# Patient Record
Sex: Female | Born: 2000 | Race: Black or African American | Hispanic: No | Marital: Single | Smoking: Never smoker
Health system: Southern US, Community
[De-identification: ages and names within clinical notes are randomized; demographics above are authoritative.]

---
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
## 2018-02-22 ENCOUNTER — Encounter (HOSPITAL_COMMUNITY): Payer: Self-pay

## 2018-02-22 ENCOUNTER — Ambulatory Visit (HOSPITAL_COMMUNITY)
Admission: EM | Admit: 2018-02-22 | Discharge: 2018-02-22 | Disposition: A | Discharge status: Home / Self Care | Payer: Medicaid Other | Attending: Family Medicine | Admitting: Family Medicine

## 2018-02-22 DIAGNOSIS — H6502 Acute serous otitis media, left ear: Secondary | ICD-10-CM

## 2018-02-22 DIAGNOSIS — H6122 Impacted cerumen, left ear: Secondary | ICD-10-CM

## 2018-02-22 MED ORDER — AMOXICILLIN 875 MG PO TABS: 875.0000 mg | ORAL_TABLET | Freq: Two times a day (BID) | ORAL | 0 refills | Status: DC

## 2018-02-22 NOTE — ED Provider Notes (Signed)
[facility]    CSN: [id number] Arrival date & time: 02/22/18  1540     History   Chief Complaint Chief Complaint  Patient presents with  . Otalgia    HPI Hayley Cannon is a 17 y.o. female.   Pt presents with ear pain In both ears and both feeling clogged and popping for the past 5 days.  She has had some slight decrease in hearing as well.  There is been no dizziness.  She said no fever.  The axilla infection that she suffered from recently has cleared.     History reviewed. No pertinent past medical history.  There are no active problems to display for this patient.   History reviewed. No pertinent surgical history.  OB History    Gravida  0   Para  0   Term  0   Preterm  0   AB  0   Living  0     SAB  0   TAB  0   Ectopic  0   Multiple  0   Live Births               Home Medications    Prior to Admission medications   Medication Sig Start Date End Date Taking? Authorizing Provider  acetaminophen (TYLENOL) 325 MG tablet Take 2 tablets (650 mg total) by mouth every 6 (six) hours as needed for mild pain or moderate pain. 10/23/16   Sherrilee GillesScoville, Brittany N, NP  amoxicillin (AMOXIL) 875 MG tablet Take 1 tablet (875 mg total) by mouth 2 (two) times daily. 02/22/18   Elvina SidleLauenstein, Sahmir Weatherbee, MD  medroxyPROGESTERone (PROVERA) 5 MG tablet Take 1 tab PO QD until bleeding resolves. 07/20/15   Burroughs, Cherre RobinsZachary Taylor, MD  polyethylene glycol powder [facility](GLYCOLAX/MIRALAX) powder Please take 8 capfuls of Miralax powder with 32-64 ounces of water for constipation cleanout. You may take 1-2 capfuls per day as needed for constipation after the cleanout is done. 11/08/15   Sherrilee GillesScoville, Brittany N, NP    Family History Family History  Problem Relation Age of Onset  . Healthy Mother   . Healthy Sister     Social History Social History   Tobacco Use  . Smoking status: Never Smoker  . Smokeless tobacco: Never Used  Substance Use Topics  . Alcohol use:  No  . Drug use: No     Allergies   Patient has no known allergies.   Review of Systems Review of Systems   Physical Exam Triage Vital Signs ED Triage Vitals  Enc Vitals Group     BP 02/22/18 1642 125/76     Pulse Rate 02/22/18 1642 64     Resp 02/22/18 1642 20     Temp 02/22/18 1642 99 F (37.2 C)     Temp Source 02/22/18 1642 Oral     SpO2 02/22/18 1642 100 %     Weight --      Height --      Head Circumference --      Peak Flow --      Pain Score 02/22/18 1644 5     Pain Loc --      Pain Edu? --      Excl. in GC? --    No data found.  Updated Vital Signs BP 125/76 (BP Location: Left Arm)   Pulse 64   Temp 99 F (37.2 C) (Oral)   Resp 20   SpO2 100%    Physical Exam  Constitutional: She  is oriented to person, place, and time. She appears well-developed and well-nourished.  HENT:  Mouth/Throat: Oropharynx is clear and moist.  Wet earwax in the left canal  Bilateral ear retraction  Eyes: Pupils are equal, round, and reactive to light. Conjunctivae are normal.  Neck: Normal range of motion. Neck supple.  Pulmonary/Chest: Effort normal.  Musculoskeletal: Normal range of motion.  Neurological: She is alert and oriented to person, place, and time.  Skin: Skin is warm and dry.  Psychiatric: She has a normal mood and affect.  Nursing note and vitals reviewed.    UC Treatments / Results  Labs (all labs ordered are listed, but only abnormal results are displayed) Labs Reviewed - No data to display  EKG None  Radiology No results found.  Procedures Procedures (including critical care time)  Medications Ordered in UC Medications - No data to display  Initial Impression / Assessment and Plan / UC Course  I have reviewed the triage vital signs and the nursing notes.  Pertinent labs & imaging results that were available during my care of the patient were reviewed by me and considered in my medical decision making (see chart for details).    Final  Clinical Impressions(s) / UC Diagnoses   Final diagnoses:  Non-recurrent acute serous otitis media of left ear  Impacted cerumen of left ear   Discharge Instructions   None    ED Prescriptions    Medication Sig Dispense Auth. Provider   amoxicillin (AMOXIL) 875 MG tablet Take 1 tablet (875 mg total) by mouth 2 (two) times daily. 14 tablet Elvina Sidle, MD     Controlled Substance Prescriptions  Controlled Substance Registry consulted? Not Applicable   Elvina Sidle, MD 02/22/18 1655

## 2018-02-22 NOTE — ED Triage Notes (Signed)
Pt presents withy ear pain In both ears and both feeling clogged and popping.

## 2018-04-01 ENCOUNTER — Encounter (HOSPITAL_COMMUNITY): Payer: Self-pay | Admitting: Emergency Medicine

## 2018-04-01 ENCOUNTER — Ambulatory Visit (HOSPITAL_COMMUNITY)
Admission: EM | Admit: 2018-04-01 | Discharge: 2018-04-01 | Disposition: A | Discharge status: Home / Self Care | Payer: Medicaid Other | Attending: Physician Assistant | Admitting: Physician Assistant

## 2018-04-01 DIAGNOSIS — H60393 Other infective otitis externa, bilateral: Secondary | ICD-10-CM | POA: Insufficient documentation

## 2018-04-01 DIAGNOSIS — H938X3 Other specified disorders of ear, bilateral: Secondary | ICD-10-CM

## 2018-04-01 MED ORDER — AMOXICILLIN-POT CLAVULANATE 875-125 MG PO TABS: 1.0000 | ORAL_TABLET | Freq: Two times a day (BID) | ORAL | 0 refills | Status: DC

## 2018-04-01 MED ORDER — FLUTICASONE PROPIONATE 50 MCG/ACT NA SUSP: 2.0000 | Freq: Every day | NASAL | 0 refills | Status: DC

## 2018-04-01 MED ORDER — OFLOXACIN 0.3 % OP SOLN: OPHTHALMIC | 0 refills | Status: DC

## 2018-04-01 NOTE — ED Triage Notes (Signed)
Pt here with bilateral ear pain and fullness

## 2018-04-01 NOTE — ED Provider Notes (Signed)
[facility]    CSN: [id number] Arrival date & time: 04/01/18  1422     History   Chief Complaint Chief Complaint  Patient presents with  . Otalgia    appt 1645    HPI Hayley Cannon is a 17 y.o. female.   17 year old female comes in for 3-4 week history of bilateral ear pain. States about 1 month ago, was treated for otitis media with amoxicillin. Symptoms seemed to have improved prior to current symptom onset. She has had ear fullness. Denies ear drainage. Denies rhinorrhea, nasal congestion, cough. Denies fever, chills, night sweats. Uses cotton swab multiple times a day.      History reviewed. No pertinent past medical history.  There are no active problems to display for this patient.   History reviewed. No pertinent surgical history.  OB History    Gravida  0   Para  0   Term  0   Preterm  0   AB  0   Living  0     SAB  0   TAB  0   Ectopic  0   Multiple  0   Live Births               Home Medications    Prior to Admission medications   Medication Sig Start Date End Date Taking? Authorizing Provider  acetaminophen (TYLENOL) 325 MG tablet Take 2 tablets (650 mg total) by mouth every 6 (six) hours as needed for mild pain or moderate pain. 10/23/16   Sherrilee GillesScoville, Brittany N, NP  amoxicillin-clavulanate (AUGMENTIN) 875-125 MG tablet Take 1 tablet by mouth every 12 (twelve) hours. 04/01/18   Cathie HoopsYu,  V, PA-C  fluticasone (FLONASE) 50 MCG/ACT nasal spray Place 2 sprays into both nostrils daily. 04/01/18   Cathie HoopsYu,  V, PA-C  medroxyPROGESTERone (PROVERA) 5 MG tablet Take 1 tab PO QD until bleeding resolves. Patient not taking: Reported on 04/01/2018 07/20/15   Drexel IhaBurroughs, Zachary Taylor, MD  ofloxacin (OCUFLOX) 0.3 % ophthalmic solution 10 drops in each ear once daily for 7 days 04/01/18   Cathie HoopsYu,  V, PA-C  polyethylene glycol powder (GLYCOLAX/MIRALAX) powder Please take 8 capfuls of Miralax powder with 32-64 ounces of water for constipation  cleanout. You may take 1-2 capfuls per day as needed for constipation after the cleanout is done. 11/08/15   Sherrilee GillesScoville, Brittany N, NP    Family History Family History  Problem Relation Age of Onset  . Healthy Mother   . Healthy Sister     Social History Social History   Tobacco Use  . Smoking status: Never Smoker  . Smokeless tobacco: Never Used  Substance Use Topics  . Alcohol use: No  . Drug use: No     Allergies   Patient has no known allergies.   Review of Systems Review of Systems  Reason unable to perform ROS: See HPI as above.     Physical Exam Triage Vital Signs ED Triage Vitals  Enc Vitals Group     BP 04/01/18 1455 (!) 135/91     Pulse Rate 04/01/18 1455 81     Resp 04/01/18 1455 18     Temp 04/01/18 1455 98.1 F (36.7 C)     Temp Source 04/01/18 1455 Oral     SpO2 04/01/18 1455 98 %     Weight --      Height --      Head Circumference --      Peak Flow --  Pain Score 04/01/18 1456 3     Pain Loc --      Pain Edu? --      Excl. in GC? --    No data found.  Updated Vital Signs BP (!) 135/91 (BP Location: Right Arm)   Pulse 81   Temp 98.1 F (36.7 C) (Oral)   Resp 18   SpO2 98%   Physical Exam Constitutional:      General: She is not in acute distress.    Appearance: She is well-developed.  HENT:     Head: Normocephalic and atraumatic.     Right Ear: External ear normal.     Left Ear: External ear normal.     Ears:     Comments: Tenderness to palpation of bilateral tragus. Mild swelling with erythema to bilateral ear canal. TM not visible due to cerumen bilaterally.     Nose: Nose normal.     Right Sinus: No maxillary sinus tenderness or frontal sinus tenderness.     Left Sinus: No maxillary sinus tenderness or frontal sinus tenderness.     Mouth/Throat:     Pharynx: Uvula midline.  Eyes:     Conjunctiva/sclera: Conjunctivae normal.     Pupils: Pupils are equal, round, and reactive to light.  Neck:     Musculoskeletal: Normal  range of motion and neck supple.  Cardiovascular:     Rate and Rhythm: Normal rate and regular rhythm.     Heart sounds: Normal heart sounds. No murmur. No friction rub. No gallop.   Pulmonary:     Effort: Pulmonary effort is normal.     Breath sounds: Normal breath sounds. No decreased breath sounds, wheezing, rhonchi or rales.  Lymphadenopathy:     Cervical: No cervical adenopathy.  Skin:    General: Skin is warm and dry.  Neurological:     Mental Status: She is alert and oriented to person, place, and time.  Psychiatric:        Behavior: Behavior normal.        Judgment: Judgment normal.      UC Treatments / Results  Labs (all labs ordered are listed, but only abnormal results are displayed) Labs Reviewed - No data to display  EKG None  Radiology No results found.  Procedures Procedures (including critical care time)  Medications Ordered in UC Medications - No data to display  Initial Impression / Assessment and Plan / UC Course  I have reviewed the triage vital signs and the nursing notes.  Pertinent labs & imaging results that were available during my care of the patient were reviewed by me and considered in my medical decision making (see chart for details).    Given otitis externa, will avoid ear irrigation. Ofloxacin as directed. Will cover for otitis media with augmentin. flonase as directed. Return precautions given. Patient expresses understanding and agrees to plan.  Final Clinical Impressions(s) / UC Diagnoses   Final diagnoses:  Other infective acute otitis externa of both ears  Sensation of fullness in both ears    ED Prescriptions    Medication Sig Dispense Auth. Provider   amoxicillin-clavulanate (AUGMENTIN) 875-125 MG tablet Take 1 tablet by mouth every 12 (twelve) hours. 14 tablet ,  V, PA-C   ofloxacin (OCUFLOX) 0.3 % ophthalmic solution 10 drops in each ear once daily for 7 days 5 mL ,  V, PA-C   fluticasone (FLONASE) 50 MCG/ACT  nasal spray Place 2 sprays into both nostrils daily. 1 g Belinda Fisher,  V,  Jeannine Boga, Lynnea Ferrier, PA-C 04/01/18 [phone]

## 2018-04-01 NOTE — Discharge Instructions (Signed)
Start augmentin and ofloxacin as directed. Flonase as directed. Avoid using qtips for now. Follow up with PCP for further evaluation if symptoms not improving.

## 2019-08-08 ENCOUNTER — Other Ambulatory Visit: Payer: Self-pay

## 2019-08-08 ENCOUNTER — Ambulatory Visit (HOSPITAL_COMMUNITY)
Admission: EM | Admit: 2019-08-08 | Discharge: 2019-08-08 | Disposition: A | Discharge status: Home / Self Care | Payer: Medicaid Other | Attending: Internal Medicine | Admitting: Internal Medicine

## 2019-08-08 ENCOUNTER — Encounter (HOSPITAL_COMMUNITY): Payer: Self-pay

## 2019-08-08 DIAGNOSIS — G43009 Migraine without aura, not intractable, without status migrainosus: Secondary | ICD-10-CM | POA: Diagnosis not present

## 2019-08-08 MED ORDER — SUMATRIPTAN SUCCINATE 6 MG/0.5ML ~~LOC~~ SOLN
6.0000 mg | Freq: Once | SUBCUTANEOUS | Status: AC
  Administered 2019-08-08: 09:00:00 6 mg via SUBCUTANEOUS

## 2019-08-08 MED ORDER — SUMATRIPTAN SUCCINATE 6 MG/0.5ML ~~LOC~~ SOLN
SUBCUTANEOUS | Status: AC
  Filled 2019-08-08: qty 0.5

## 2019-08-08 MED ORDER — SUMATRIPTAN SUCCINATE 25 MG PO TABS: 25.0000 mg | ORAL_TABLET | Freq: Once | ORAL | 0 refills | Status: DC

## 2019-08-08 MED ORDER — KETOROLAC TROMETHAMINE 30 MG/ML IJ SOLN
30.0000 mg | Freq: Once | INTRAMUSCULAR | Status: AC
  Administered 2019-08-08: 30 mg via INTRAMUSCULAR

## 2019-08-08 MED ORDER — METOCLOPRAMIDE HCL 5 MG/ML IJ SOLN
5.0000 mg | Freq: Once | INTRAMUSCULAR | Status: AC
  Administered 2019-08-08: 09:00:00 5 mg via INTRAMUSCULAR

## 2019-08-08 MED ORDER — KETOROLAC TROMETHAMINE 30 MG/ML IJ SOLN
INTRAMUSCULAR | Status: AC
  Filled 2019-08-08: qty 1

## 2019-08-08 MED ORDER — METOCLOPRAMIDE HCL 5 MG/ML IJ SOLN
INTRAMUSCULAR | Status: AC
  Filled 2019-08-08: qty 2

## 2019-08-08 MED ORDER — IBUPROFEN 600 MG PO TABS: 600.0000 mg | ORAL_TABLET | Freq: Four times a day (QID) | ORAL | 0 refills | Status: DC | PRN

## 2019-08-08 NOTE — ED Provider Notes (Addendum)
[facility]    CSN: [id number] Arrival date & time: 08/08/19  7253      History   Chief Complaint Chief Complaint  Patient presents with  . Headache    HPI Hayley Cannon is a 19 y.o. female comes to urgent care with complaints of frontal headache of 6 days duration.  Patient has frontal headache with some periorbital pain.  Headache is persistent.  Aggravated by light.  And it is relieved when she sleeps.  She denies any floaters in her visual field.  No fever or chills.  No nausea or vomiting.  No nasal congestion or runny nose.  No cough or sputum production.  No sick contacts.  No loss of taste or smell.  No history of migraines.  No double vision or blurry vision.  No extremity weakness.  HPI  History reviewed. No pertinent past medical history.  There are no problems to display for this patient.   History reviewed. No pertinent surgical history.  OB History    Gravida  0   Para  0   Term  0   Preterm  0   AB  0   Living  0     SAB  0   TAB  0   Ectopic  0   Multiple  0   Live Births               Home Medications    Prior to Admission medications   Medication Sig Start Date End Date Taking? Authorizing Provider  acetaminophen (TYLENOL) 325 MG tablet Take 2 tablets (650 mg total) by mouth every 6 (six) hours as needed for mild pain or moderate pain. 10/23/16   Jean Rosenthal, NP  fluticasone (FLONASE) 50 MCG/ACT nasal spray Place 2 sprays into both nostrils daily. 04/01/18   Tasia Catchings, Amy V, PA-C  ibuprofen (ADVIL) 600 MG tablet Take 1 tablet (600 mg total) by mouth every 6 (six) hours as needed. 08/08/19   Kadan Millstein, Myrene Galas, MD  polyethylene glycol powder (GLYCOLAX/MIRALAX) powder Please take 8 capfuls of Miralax powder with 32-64 ounces of water for constipation cleanout. You may take 1-2 capfuls per day as needed for constipation after the cleanout is done. 11/08/15   Jean Rosenthal, NP  SUMAtriptan (IMITREX) 25 MG tablet Take 1  tablet (25 mg total) by mouth once for 1 dose. May repeat in 2 hours if headache persists or recurs. 08/08/19 08/08/19  Chase Picket, MD  medroxyPROGESTERone (PROVERA) 5 MG tablet Take 1 tab PO QD until bleeding resolves. Patient not taking: Reported on 04/01/2018 07/20/15 08/08/19  Smith Mince, MD    Family History Family History  Problem Relation Age of Onset  . Healthy Mother   . Healthy Sister     Social History Social History   Tobacco Use  . Smoking status: Never Smoker  . Smokeless tobacco: Never Used  Substance Use Topics  . Alcohol use: No  . Drug use: No     Allergies   Patient has no known allergies.   Review of Systems Review of Systems  Constitutional: Negative for activity change, fatigue and fever.  HENT: Negative for congestion, ear discharge, ear pain, sinus pressure, sinus pain and sore throat.   Respiratory: Negative for cough, chest tightness and shortness of breath.   Gastrointestinal: Negative.  Negative for nausea and vomiting.  Genitourinary: Negative.   Musculoskeletal: Negative.   Neurological: Positive for headaches. Negative for dizziness and light-headedness.  Physical Exam Triage Vital Signs ED Triage Vitals  Enc Vitals Group     BP 08/08/19 0843 121/75     Pulse Rate 08/08/19 0843 61     Resp 08/08/19 0843 17     Temp 08/08/19 0843 98.4 F (36.9 C)     Temp Source 08/08/19 0843 Oral     SpO2 08/08/19 0843 98 %     Weight --      Height --      Head Circumference --      Peak Flow --      Pain Score 08/08/19 0841 8     Pain Loc --      Pain Edu? --      Excl. in GC? --    No data found.  Updated Vital Signs BP 121/75 (BP Location: Right Arm)   Pulse 61   Temp 98.4 F (36.9 C) (Oral)   Resp 17   LMP  (Within Years) Comment: 2 years, pt states her PCP is aware of this.   SpO2 98%   Visual Acuity Right Eye Distance:   Left Eye Distance:   Bilateral Distance:    Right Eye Near:   Left Eye Near:      Bilateral Near:     Physical Exam Vitals and nursing note reviewed.  Constitutional:      General: She is not in acute distress.    Appearance: She is not ill-appearing.  HENT:     Mouth/Throat:     Mouth: Mucous membranes are moist.  Eyes:     Extraocular Movements: Extraocular movements intact.     Right eye: Normal extraocular motion and no nystagmus.     Left eye: Normal extraocular motion and no nystagmus.     Pupils: Pupils are equal, round, and reactive to light.  Cardiovascular:     Rate and Rhythm: Normal rate and regular rhythm.  Pulmonary:     Effort: Pulmonary effort is normal.     Breath sounds: Normal breath sounds. No wheezing or rhonchi.  Abdominal:     General: Bowel sounds are normal.     Palpations: Abdomen is soft.  Musculoskeletal:     Cervical back: Normal range of motion.  Skin:    General: Skin is warm and dry.     Capillary Refill: Capillary refill takes less than 2 seconds.  Neurological:     Mental Status: She is alert.     GCS: GCS eye subscore is 4. GCS verbal subscore is 5. GCS motor subscore is 6.     Cranial Nerves: No cranial nerve deficit or dysarthria.     Sensory: No sensory deficit.     Motor: No weakness.     Deep Tendon Reflexes: Reflexes normal.      UC Treatments / Results  Labs (all labs ordered are listed, but only abnormal results are displayed) Labs Reviewed - No data to display  EKG   Radiology No results found.  Procedures Procedures (including critical care time)  Medications Ordered in UC Medications  ketorolac (TORADOL) 30 MG/ML injection 30 mg (has no administration in time range)  metoCLOPramide (REGLAN) injection 5 mg (has no administration in time range)  SUMAtriptan (IMITREX) injection 6 mg (has no administration in time range)    Initial Impression / Assessment and Plan / UC Course  I have reviewed the triage vital signs and the nursing notes.  Pertinent labs & imaging results that were available  during my care of the  patient were reviewed by me and considered in my medical decision making (see chart for details).     1.  Headache: This is likely a migraine headache Headache protocol-Toradol, Reglan, Imitrex. Prescribe ibuprofen and Imitrex Return precautions given If patient continues to have headaches she may need neurology evaluation and or CT scan of the head.  Patient verbalized understanding of the plan. Final Clinical Impressions(s) / UC Diagnoses   Final diagnoses:  Migraine without aura and without status migrainosus, not intractable   Discharge Instructions   None    ED Prescriptions    Medication Sig Dispense Auth. Provider   ibuprofen (ADVIL) 600 MG tablet Take 1 tablet (600 mg total) by mouth every 6 (six) hours as needed. 30 tablet Natalee Tomkiewicz, Britta Mccreedy, MD   SUMAtriptan (IMITREX) 25 MG tablet Take 1 tablet (25 mg total) by mouth once for 1 dose. May repeat in 2 hours if headache persists or recurs. 15 tablet Avaley Coop, Britta Mccreedy, MD     PDMP not reviewed this encounter.   Merrilee Jansky, MD 08/08/19 1610    Merrilee Jansky, MD 08/08/19 [phone]

## 2019-08-08 NOTE — ED Triage Notes (Signed)
Pt reports having headaches x 6 days. Pt reports she feels pressure in her forehead and under her eyes.

## 2019-10-24 ENCOUNTER — Encounter (HOSPITAL_COMMUNITY): Payer: Self-pay | Admitting: Emergency Medicine

## 2019-10-24 ENCOUNTER — Emergency Department (HOSPITAL_COMMUNITY)
Admission: EM | Admit: 2019-10-24 | Discharge: 2019-10-24 | Disposition: A | Discharge status: Home / Self Care | Payer: Medicaid Other | Attending: Emergency Medicine | Admitting: Emergency Medicine

## 2019-10-24 ENCOUNTER — Emergency Department (HOSPITAL_COMMUNITY): Payer: Medicaid Other

## 2019-10-24 ENCOUNTER — Other Ambulatory Visit: Payer: Self-pay

## 2019-10-24 DIAGNOSIS — X501XXA Overexertion from prolonged static or awkward postures, initial encounter: Secondary | ICD-10-CM | POA: Insufficient documentation

## 2019-10-24 DIAGNOSIS — W19XXXA Unspecified fall, initial encounter: Secondary | ICD-10-CM

## 2019-10-24 DIAGNOSIS — S93402A Sprain of unspecified ligament of left ankle, initial encounter: Secondary | ICD-10-CM | POA: Insufficient documentation

## 2019-10-24 DIAGNOSIS — Y999 Unspecified external cause status: Secondary | ICD-10-CM | POA: Diagnosis not present

## 2019-10-24 DIAGNOSIS — Y929 Unspecified place or not applicable: Secondary | ICD-10-CM | POA: Insufficient documentation

## 2019-10-24 DIAGNOSIS — W010XXA Fall on same level from slipping, tripping and stumbling without subsequent striking against object, initial encounter: Secondary | ICD-10-CM | POA: Insufficient documentation

## 2019-10-24 DIAGNOSIS — S99912A Unspecified injury of left ankle, initial encounter: Secondary | ICD-10-CM | POA: Diagnosis present

## 2019-10-24 DIAGNOSIS — Y939 Activity, unspecified: Secondary | ICD-10-CM | POA: Diagnosis not present

## 2019-10-24 MED ORDER — IBUPROFEN 400 MG PO TABS
600.0000 mg | ORAL_TABLET | Freq: Once | ORAL | Status: AC
  Administered 2019-10-24: 600 mg via ORAL
  Filled 2019-10-24: qty 1

## 2019-10-24 NOTE — ED Notes (Signed)
Patient transported to X-ray 

## 2019-10-24 NOTE — ED Provider Notes (Signed)
[facility] EMERGENCY DEPARTMENT Provider Note   CSN: [id number] Arrival date & time: 10/24/19  0204     History Chief Complaint  Patient presents with  . Ankle Pain    Hayley Cannon is a 19 y.o. female with no significant past medical history who presents the emergency department with a chief complaint of fall.  The patient reports that she got out of bed to use the restroom just prior to arrival when she tripped and fell over a toy in the floor.  She twisted her ankle when she fell.  Reports that she fell forward.  She denies hitting her head, LOC, nausea, or vomiting.  She reports that she is having pain from her left thigh to her left foot.  Reports that it took her approximately 20 minutes to get up after the fall.  She has been unable to bear weight on the left leg since the injury.  No history of previous left leg injuries or surgeries.  No treatment prior to arrival.  She denies numbness, weakness, left hip pain, back pain, neck pain.  No concerns for pregnancy at this time.  The history is provided by the patient and medical records. No language interpreter was used.       History reviewed. No pertinent past medical history.  There are no problems to display for this patient.   History reviewed. No pertinent surgical history.   OB History    Gravida  0   Para  0   Term  0   Preterm  0   AB  0   Living  0     SAB  0   TAB  0   Ectopic  0   Multiple  0   Live Births              Family History  Problem Relation Age of Onset  . Healthy Mother   . Healthy Sister     Social History   Tobacco Use  . Smoking status: Never Smoker  . Smokeless tobacco: Never Used  Substance Use Topics  . Alcohol use: No  . Drug use: No    Home Medications Prior to Admission medications   Medication Sig Start Date End Date Taking? Authorizing Provider  acetaminophen (TYLENOL) 325 MG tablet Take 2 tablets (650 mg total) by mouth every 6  (six) hours as needed for mild pain or moderate pain. 10/23/16   Sherrilee Gilles, NP  fluticasone (FLONASE) 50 MCG/ACT nasal spray Place 2 sprays into both nostrils daily. 04/01/18   Cathie Hoops, Amy V, PA-C  ibuprofen (ADVIL) 600 MG tablet Take 1 tablet (600 mg total) by mouth every 6 (six) hours as needed. 08/08/19   Lamptey, Britta Mccreedy, MD  polyethylene glycol powder (GLYCOLAX/MIRALAX) powder Please take 8 capfuls of Miralax powder with 32-64 ounces of water for constipation cleanout. You may take 1-2 capfuls per day as needed for constipation after the cleanout is done. 11/08/15   Sherrilee Gilles, NP  SUMAtriptan (IMITREX) 25 MG tablet Take 1 tablet (25 mg total) by mouth once for 1 dose. May repeat in 2 hours if headache persists or recurs. 08/08/19 08/08/19  Merrilee Jansky, MD  medroxyPROGESTERone (PROVERA) 5 MG tablet Take 1 tab PO QD until bleeding resolves. Patient not taking: Reported on 04/01/2018 07/20/15 08/08/19  Drexel Iha, MD    Allergies    Patient has no known allergies.  Review of Systems   Review of Systems  Constitutional: Negative for activity change, chills and fever.  Respiratory: Negative for shortness of breath.   Cardiovascular: Negative for chest pain.  Gastrointestinal: Negative for abdominal pain.  Musculoskeletal: Positive for arthralgias, gait problem, joint swelling and myalgias. Negative for back pain, neck pain and neck stiffness.  Skin: Negative for rash and wound.  Neurological: Negative for weakness and numbness.    Physical Exam Updated Vital Signs BP 128/84 (BP Location: Right Arm)   Pulse 72   Temp 98.7 F (37.1 C) (Oral)   Resp 16   SpO2 100%   Physical Exam Vitals and nursing note reviewed.  Constitutional:      General: She is not in acute distress. HENT:     Head: Normocephalic.  Eyes:     Conjunctiva/sclera: Conjunctivae normal.  Cardiovascular:     Rate and Rhythm: Normal rate and regular rhythm.     Heart sounds: No murmur  heard.  No friction rub. No gallop.   Pulmonary:     Effort: Pulmonary effort is normal. No respiratory distress.  Abdominal:     General: There is no distension.     Palpations: Abdomen is soft.  Musculoskeletal:        General: Swelling, tenderness and signs of injury present. No deformity.     Cervical back: Neck supple.     Right lower leg: No edema.     Comments: Spine is nontender.  No crepitus or step-offs.  Normal exam of the right lower extremity.  No tenderness to the left hip.  No medial or lateral joint line tenderness to the left knee.  No tenderness to the patella.  Full active and passive range of motion of the left knee.  With range of motion of the left knee, she has pain in the left calf.  She also has reproducible focal tenderness to palpation to the mid calf on exam.  Achilles tendon is intact.  No ecchymosis or wounds noted to the left lower leg.  She is tender to palpation over the lateral malleolus of the left ankle.  No medial malleolus tenderness.  No tenderness to the left foot.  Sensation is intact throughout.  DP and PT pulses are 2+ and symmetric.  Good capillary refill of the bilateral lower extremities.  Skin:    General: Skin is warm.     Findings: No rash.  Neurological:     Mental Status: She is alert.  Psychiatric:        Behavior: Behavior normal.     ED Results / Procedures / Treatments   Labs (all labs ordered are listed, but only abnormal results are displayed) Labs Reviewed - No data to display  EKG None  Radiology DG Tibia/Fibula Left  Result Date: 10/24/2019 CLINICAL DATA:  Fall.  Pain. EXAM: LEFT TIBIA AND FIBULA - 2 VIEW COMPARISON:  No prior. FINDINGS: No acute soft tissue bony abnormality identified. No evidence of fracture. No radiopaque foreign body. IMPRESSION: No acute abnormality identified. Electronically Signed   By: Maisie Fus  Register   On: 10/24/2019 06:28   DG Ankle Complete Left  Result Date: 10/24/2019 CLINICAL DATA:   Tripped, swelling, pain, inability to bear weight EXAM: LEFT ANKLE COMPLETE - 3+ VIEW COMPARISON:  None. FINDINGS: Frontal, oblique, lateral views of the left ankle are obtained. No fracture, subluxation, or dislocation. Joint spaces are well preserved. Soft tissues are normal. IMPRESSION: 1. Unremarkable left ankle. Electronically Signed   By: Sharlet Salina M.D.   On: 10/24/2019 02:56  Procedures Procedures (including critical care time)  Medications Ordered in ED Medications  ibuprofen (ADVIL) tablet 600 mg (600 mg Oral Given 10/24/19 7353)    ED Course  I have reviewed the triage vital signs and the nursing notes.  Pertinent labs & imaging results that were available during my care of the patient were reviewed by me and considered in my medical decision making (see chart for details).    MDM Rules/Calculators/A&P                          19 year old female with no significant past medical history who presents to the emergency department after a fall.  She did not hit her head.  No syncope, nausea, vomiting.  She is having pain in her left leg.  She is tender to palpation to the left ankle as well as the left lower leg.  Achilles tendon is intact.  Left knee is nontender to palpation, but she does have referred pain to the lower leg.  There is pain and swelling noted to the lateral malleolus on the left.  Left foot is nontender.  She is neurovascularly intact.  X-ray of the left ankle is unremarkable.  However, given her point tenderness to the mid lower leg on exam, will order x-ray of the tibia and fibula.  If negative, patient can be discharged to home with RICE therapy, crutches, and ankle brace with orthopedics follow-up as needed. X-ray of the left tibia and fibula is negative.  Referral given.  ER return precautions given.  She is hemodynamically stable to no acute distress.  Safe for discharge home with outpatient follow-up.  Final Clinical Impression(s) / ED Diagnoses Final  diagnoses:  Fall from standing, initial encounter  Sprain of left ankle, unspecified ligament, initial encounter    Rx / DC Orders ED Discharge Orders    None       Barkley Boards, PA-C 10/24/19 0653    Nira Conn, MD 10/24/19 [phone]

## 2019-10-24 NOTE — ED Notes (Signed)
All discharge instructions reviewed with pt, who verbalized understanding. Pt discharged without issue.  

## 2019-10-24 NOTE — ED Triage Notes (Signed)
Pt reports tripping over a toy twisting his left ankle.  Pt reports she is unable to put pressure on ankle that is swelling.

## 2019-10-24 NOTE — Discharge Instructions (Signed)
Thank you for allowing me to care for you today in the Emergency Department.   You were seen today for a fall.  Your x-rays were negative.  Your exam is consistent with an ankle sprain.  Take 650 mg of Tylenol or 600 mg of ibuprofen with food every 6 hours for pain.  You can alternate between these 2 medications every 3 hours if your pain returns.  For instance, you can take Tylenol at noon, followed by a dose of ibuprofen at 3, followed by second dose of Tylenol and 6.  Apply ice pack for 15 to 20 minutes up to 3-4 times a day.  Try to elevate your left leg so that your toes are at or above the level of your nose.  Wear the brace on your left ankle to provide compression and help with pain.  Use the crutches until you can easily put weight on your left foot.  You can follow-up with primary care if your symptoms do not significantly improve within the next week.  If you do not have a primary care clinician, you can call the number on your discharge paperwork to get established with one.  Return to the emergency department if you have any fall or injury, if your toes turn blue, if you develop new weakness, numbness, or other new, concerning symptoms.

## 2019-10-24 NOTE — Progress Notes (Signed)
Orthopedic Tech Progress Note Patient Details:  Hayley Cannon Apr 08, 2000 [id number]  Ortho Devices Type of Ortho Device: ASO, Crutches Ortho Device/Splint Location: LLE Ortho Device/Splint Interventions: Application, Adjustment   Post Interventions Patient Tolerated: Well, Ambulated well Instructions Provided: Adjustment of device, Poper ambulation with device   Hayley Cannon [name] 10/24/2019, 6:59 AM

## 2019-10-29 ENCOUNTER — Institutional Professional Consult (permissible substitution): Payer: Medicaid Other | Admitting: Plastic Surgery

## 2020-10-18 ENCOUNTER — Emergency Department
Admission: EM | Admit: 2020-10-18 | Discharge: 2020-10-19 | Disposition: A | Discharge status: Home / Self Care | Payer: Medicaid Other | Attending: Emergency Medicine | Admitting: Emergency Medicine

## 2020-10-18 ENCOUNTER — Emergency Department: Payer: Medicaid Other

## 2020-10-18 ENCOUNTER — Other Ambulatory Visit: Payer: Self-pay

## 2020-10-18 DIAGNOSIS — U071 COVID-19: Secondary | ICD-10-CM | POA: Diagnosis not present

## 2020-10-18 DIAGNOSIS — R059 Cough, unspecified: Secondary | ICD-10-CM | POA: Diagnosis present

## 2020-10-18 LAB — URINALYSIS, COMPLETE (UACMP) WITH MICROSCOPIC
Bilirubin Urine: NEGATIVE
Glucose, UA: NEGATIVE mg/dL
Ketones, ur: 5 mg/dL — AB
Leukocytes,Ua: NEGATIVE
Nitrite: NEGATIVE
Protein, ur: NEGATIVE mg/dL
Specific Gravity, Urine: 1.008 (ref 1.005–1.030)
pH: 7 (ref 5.0–8.0)

## 2020-10-18 LAB — COMPREHENSIVE METABOLIC PANEL
ALT: 28 U/L (ref 0–44)
AST: 27 U/L (ref 15–41)
Albumin: 4.3 g/dL (ref 3.5–5.0)
Alkaline Phosphatase: 66 U/L (ref 38–126)
Anion gap: 7 (ref 5–15)
BUN: 8 mg/dL (ref 6–20)
CO2: 25 mmol/L (ref 22–32)
Calcium: 9.2 mg/dL (ref 8.9–10.3)
Chloride: 104 mmol/L (ref 98–111)
Creatinine, Ser: 0.82 mg/dL (ref 0.44–1.00)
GFR, Estimated: >60 mL/min (ref >60)
Glucose, Bld: 101 mg/dL — ABNORMAL HIGH (ref 70–99)
Potassium: 3.4 mmol/L — ABNORMAL LOW (ref 3.5–5.1)
Sodium: 136 mmol/L (ref 135–145)
Total Bilirubin: 0.8 mg/dL (ref 0.3–1.2)
Total Protein: 7.7 g/dL (ref 6.5–8.1)

## 2020-10-18 LAB — RESP PANEL BY RT-PCR (FLU A&B, COVID) ARPGX2
Influenza A by PCR: NEGATIVE
Influenza B by PCR: NEGATIVE
SARS Coronavirus 2 by RT PCR: POSITIVE — AB

## 2020-10-18 LAB — CBC
HCT: 41.8 % (ref 36.0–46.0)
Hemoglobin: 13.2 g/dL (ref 12.0–15.0)
MCH: 28.7 pg (ref 26.0–34.0)
MCHC: 31.6 g/dL (ref 30.0–36.0)
MCV: 90.9 fL (ref 80.0–100.0)
Platelets: 281 10*3/uL (ref 150–400)
RBC: 4.6 MIL/uL (ref 3.87–5.11)
RDW: 13.2 % (ref 11.5–15.5)
WBC: 8 10*3/uL (ref 4.0–10.5)
nRBC: 0 % (ref 0.0–0.2)

## 2020-10-18 LAB — LIPASE, BLOOD: Lipase: 22 U/L (ref 11–51)

## 2020-10-18 LAB — POC URINE PREG, ED: Preg Test, Ur: NEGATIVE

## 2020-10-18 MED ORDER — ACETAMINOPHEN 325 MG PO TABS
650.0000 mg | ORAL_TABLET | Freq: Once | ORAL | Status: AC
  Administered 2020-10-18: 650 mg via ORAL
  Filled 2020-10-18: qty 2

## 2020-10-18 MED ORDER — NAPROXEN 500 MG PO TABS
500.0000 mg | ORAL_TABLET | Freq: Once | ORAL | Status: AC
  Administered 2020-10-18: 500 mg via ORAL
  Filled 2020-10-18: qty 1

## 2020-10-18 MED ORDER — NIRMATRELVIR/RITONAVIR (PAXLOVID)TABLET: 3.0000 | ORAL_TABLET | Freq: Two times a day (BID) | ORAL | 0 refills | Status: AC

## 2020-10-18 MED ORDER — ONDANSETRON 4 MG PO TBDP
4.0000 mg | ORAL_TABLET | Freq: Once | ORAL | Status: AC | PRN
  Administered 2020-10-18: 4 mg via ORAL
  Filled 2020-10-18: qty 1

## 2020-10-18 NOTE — ED Notes (Signed)
covid pos called from lab. Dr. Katrinka Blazing alerted

## 2020-10-18 NOTE — ED Triage Notes (Signed)
Pt presents to ER c/o cough, cold chills, body aches and vomiting that started yesterday.  Pt denies being around anyone sick.  Pt A&O x4 and ambulatory to triage.

## 2020-10-18 NOTE — ED Provider Notes (Addendum)
[facility] Emergency Department Provider Note ____________________________________________   Event Date/Time   First MD Initiated Contact with Patient 10/18/20 2050     (approximate)  I have reviewed the triage vital signs and the nursing notes.  HISTORY  Chief Complaint Cough and Emesis   HPI Hayley Cannon is a 20 y.o. femalewho presents to the ED for evaluation of cough myalgias and emesis.  Chart review indicates morbidly obese patient  Patient presents to the ED for evaluation of 2 days of generalized myalgias, generalized malaise, nonproductive cough, shortness of breath, sore throat and postprandial emesis.  She denies any abdominal pain, syncopal episodes, productive cough, chest pain or pressure.  She reports a sore throat and mild odynophagia.  No known COVID-19 contacts.  Negative for dysuria.   History reviewed. No pertinent past medical history.  There are no problems to display for this patient.   History reviewed. No pertinent surgical history.  Prior to Admission medications   Medication Sig Start Date End Date Taking? Authorizing Provider  nirmatrelvir/ritonavir EUA (PAXLOVID) TABS Take 3 tablets by mouth 2 (two) times daily for 5 days. Patient GFR is >60. Take nirmatrelvir (150 mg) two tablets twice daily for 5 days and ritonavir (100 mg) one tablet twice daily for 5 days. 10/18/20 10/23/20 Yes Delton Prairie, MD  acetaminophen (TYLENOL) 325 MG tablet Take 2 tablets (650 mg total) by mouth every 6 (six) hours as needed for mild pain or moderate pain. 10/23/16   Sherrilee Gilles, NP  fluticasone (FLONASE) 50 MCG/ACT nasal spray Place 2 sprays into both nostrils daily. 04/01/18   Cathie Hoops, Amy V, PA-C  ibuprofen (ADVIL) 600 MG tablet Take 1 tablet (600 mg total) by mouth every 6 (six) hours as needed. 08/08/19   Lamptey, Britta Mccreedy, MD  polyethylene glycol powder (GLYCOLAX/MIRALAX) powder Please take 8 capfuls of Miralax powder with 32-64 ounces  of water for constipation cleanout. You may take 1-2 capfuls per day as needed for constipation after the cleanout is done. 11/08/15   Sherrilee Gilles, NP  SUMAtriptan (IMITREX) 25 MG tablet Take 1 tablet (25 mg total) by mouth once for 1 dose. May repeat in 2 hours if headache persists or recurs. 08/08/19 08/08/19  Merrilee Jansky, MD  medroxyPROGESTERone (PROVERA) 5 MG tablet Take 1 tab PO QD until bleeding resolves. Patient not taking: Reported on 04/01/2018 07/20/15 08/08/19  Drexel Iha, MD    Allergies Patient has no known allergies.  Family History  Problem Relation Age of Onset   Healthy Mother    Healthy Sister     Social History Social History   Tobacco Use   Smoking status: Never   Smokeless tobacco: Never  Substance Use Topics   Alcohol use: No   Drug use: No    Review of Systems  Constitutional: Positive for subjective fevers and chills  Eyes: No visual changes. ENT: Positive for sore throat. Cardiovascular: Denies chest pain. Respiratory: Positive for nonproductive cough Gastrointestinal: No abdominal pain.   No diarrhea.  No constipation. Positive postprandial and painless nausea and vomiting Genitourinary: Negative for dysuria. Musculoskeletal: Positive for diffuse myalgias Skin: Negative for rash. Neurological: Negative for headaches, focal weakness or numbness.  ____________________________________________   PHYSICAL EXAM:  VITAL SIGNS: Vitals:   10/18/20 2028 10/19/20 0000  BP: (!) 144/93 121/68  Pulse: (!) 114 60  Resp: (!) 22 16  Temp: (!) 103 F (39.4 C) 98.3 F (36.8 C)  SpO2: 100% 99%  Constitutional: Alert and oriented. in no acute distress.  Obese.  Appears uncomfortable.  Conversational. Eyes: Conjunctivae are normal. PERRL. EOMI. Head: Atraumatic. Nose: No congestion/rhinnorhea. Mouth/Throat: Mucous membranes are moist.  Oropharynx non-erythematous.  Uvula is midline and tonsils 1+ bilaterally without  exudate Neck: No stridor. No cervical spine tenderness to palpation. Cardiovascular: Tachycardic rate, regular rhythm. Grossly normal heart sounds.  Good peripheral circulation. Respiratory: Normal respiratory effort.  No retractions. Lungs CTAB. Gastrointestinal: Soft , nondistended, nontender to palpation. No CVA tenderness.  Benign throughout. Musculoskeletal: No lower extremity tenderness nor edema.  No joint effusions. No signs of acute trauma. Neurologic:  Normal speech and language. No gross focal neurologic deficits are appreciated. No gait instability noted. Skin:  Skin is warm, dry and intact. No rash noted. Psychiatric: Mood and affect are normal. Speech and behavior are normal.  ____________________________________________   LABS (all labs ordered are listed, but only abnormal results are displayed)  Labs Reviewed  RESP PANEL BY RT-PCR (FLU A&B, COVID) ARPGX2 - Abnormal; Notable for the following components:      Result Value   SARS Coronavirus 2 by RT PCR POSITIVE (*)    All other components within normal limits  COMPREHENSIVE METABOLIC PANEL - Abnormal; Notable for the following components:   Potassium 3.4 (*)    Glucose, Bld 101 (*)    All other components within normal limits  URINALYSIS, COMPLETE (UACMP) WITH MICROSCOPIC - Abnormal; Notable for the following components:   Color, Urine YELLOW (*)    APPearance HAZY (*)    Hgb urine dipstick MODERATE (*)    Ketones, ur 5 (*)    Bacteria, UA RARE (*)    All other components within normal limits  POC URINE PREG, ED - Normal  LIPASE, BLOOD  CBC   ____________________________________________  12 Lead EKG   ____________________________________________  RADIOLOGY  ED MD interpretation: 2 view CXR reviewed by me without evidence of acute cardiopulmonary pathology.  Official radiology report(s): DG Chest 2 View  Result Date: 10/18/2020 CLINICAL DATA:  Fever, cough EXAM: CHEST - 2 VIEW COMPARISON:  None.  FINDINGS: The heart size and mediastinal contours are within normal limits. Both lungs are clear. The visualized skeletal structures are unremarkable. IMPRESSION: No active cardiopulmonary disease. Electronically Signed   By: Helyn Numbers MD   On: 10/18/2020 21:41    ____________________________________________   PROCEDURES and INTERVENTIONS  Procedure(s) performed (including Critical Care):  Procedures  Medications  ondansetron (ZOFRAN-ODT) disintegrating tablet 4 mg (4 mg Oral Given 10/18/20 2034)  acetaminophen (TYLENOL) tablet 650 mg (650 mg Oral Given 10/18/20 2034)  naproxen (NAPROSYN) tablet 500 mg (500 mg Oral Given 10/18/20 2213)    ____________________________________________   MDM / ED COURSE   Obese but otherwise healthy 20 year old female presents to the ED with viral syndrome, consistent with COVID-19, and amenable to outpatient management with initiation of Paxlovid.  She presents febrile and tachycardic, resolving with antipyretics.  CXR without infiltrate to suggest CAP.  No evidence of bacterial etiology of her symptoms.  Doubt bacterial sepsis considering how well she looks clinically.  No evidence of pancreatitis.  No significant blood work derangements.  We will discharge with symptomatic measures and Paxlovid with return precautions.      ____________________________________________   FINAL CLINICAL IMPRESSION(S) / ED DIAGNOSES  Final diagnoses:  COVID-19     ED Discharge Orders          Ordered    nirmatrelvir/ritonavir EUA (PAXLOVID) TABS  2 times daily  10/18/20 2349             Delton Prairie   Note:  This document was prepared using Dragon voice recognition software and may include unintentional dictation errors.    Delton Prairie, MD 10/18/20 3545    Delton Prairie, MD 10/19/20 [id number]

## 2020-10-18 NOTE — Discharge Instructions (Addendum)
Use Tylenol for pain and fevers.  Up to 1000 mg per dose, up to 4 times per day.  Do not take more than 4000 mg of Tylenol/acetaminophen within 24 hours..  Use naproxen/Aleve for anti-inflammatory pain relief. Use up to 500mg  every 12 hours. Do not take more frequently than this. Do not use other NSAIDs (ibuprofen, Advil) while taking this medication. It is safe to take Tylenol with this.   Will be discharged to prescription for Paxlovid to take twice daily for the next 5 days to reduce the severity and length of the infection of your COVID-19 illness.  Return to the ED with any further worsening symptoms despite these measures.

## 2020-10-19 NOTE — ED Notes (Signed)
Pt alert and oriented x4. Verbalized followup. No questions at this time

## 2021-05-20 ENCOUNTER — Emergency Department
Admission: EM | Admit: 2021-05-20 | Discharge: 2021-05-20 | Disposition: A | Discharge status: Home / Self Care | Payer: Medicaid Other | Attending: Emergency Medicine | Admitting: Emergency Medicine

## 2021-05-20 ENCOUNTER — Encounter: Payer: Self-pay | Admitting: Emergency Medicine

## 2021-05-20 ENCOUNTER — Other Ambulatory Visit: Payer: Self-pay

## 2021-05-20 ENCOUNTER — Emergency Department: Payer: Medicaid Other

## 2021-05-20 DIAGNOSIS — M79672 Pain in left foot: Secondary | ICD-10-CM | POA: Insufficient documentation

## 2021-05-20 DIAGNOSIS — Y990 Civilian activity done for income or pay: Secondary | ICD-10-CM | POA: Insufficient documentation

## 2021-05-20 DIAGNOSIS — S9032XA Contusion of left foot, initial encounter: Secondary | ICD-10-CM | POA: Insufficient documentation

## 2021-05-20 DIAGNOSIS — W208XXA Other cause of strike by thrown, projected or falling object, initial encounter: Secondary | ICD-10-CM | POA: Insufficient documentation

## 2021-05-20 DIAGNOSIS — S99922A Unspecified injury of left foot, initial encounter: Secondary | ICD-10-CM | POA: Diagnosis present

## 2021-05-20 MED ORDER — IBUPROFEN 800 MG PO TABS
800.0000 mg | ORAL_TABLET | Freq: Once | ORAL | Status: AC
  Administered 2021-05-20: 800 mg via ORAL
  Filled 2021-05-20: qty 1

## 2021-05-20 MED ORDER — IBUPROFEN 800 MG PO TABS: 800.0000 mg | ORAL_TABLET | Freq: Three times a day (TID) | ORAL | 0 refills | Status: DC | PRN

## 2021-05-20 NOTE — ED Triage Notes (Addendum)
Patient ambulatory to triage with steady gait, without difficulty or distress noted; pt reports dropped a cart on her left foot yesterday while at work; declines desire to file workers comp

## 2021-05-20 NOTE — ED Provider Notes (Signed)
[facility] Provider Note    Event Date/Time   First MD Initiated Contact with Patient 05/20/21 [phone]     (approximate)   History   No chief complaint on file.   HPI  Hayley Cannon is a 21 y.o. female history of obesity who presents to the emergency department with left foot pain.  She states that at work yesterday she dropped a cart onto her left foot.  She states she is able to ambulate but it is very painful.  No other injury.  No numbness.   History provided by patient.    History reviewed. No pertinent past medical history.  History reviewed. No pertinent surgical history.  MEDICATIONS:  Prior to Admission medications   Medication Sig Start Date End Date Taking? Authorizing Provider  acetaminophen (TYLENOL) 325 MG tablet Take 2 tablets (650 mg total) by mouth every 6 (six) hours as needed for mild pain or moderate pain. 10/23/16   Sherrilee Gilles, NP  fluticasone (FLONASE) 50 MCG/ACT nasal spray Place 2 sprays into both nostrils daily. 04/01/18   Cathie Hoops, Amy V, PA-C  ibuprofen (ADVIL) 600 MG tablet Take 1 tablet (600 mg total) by mouth every 6 (six) hours as needed. 08/08/19   Lamptey, Britta Mccreedy, MD  polyethylene glycol powder (GLYCOLAX/MIRALAX) powder Please take 8 capfuls of Miralax powder with 32-64 ounces of water for constipation cleanout. You may take 1-2 capfuls per day as needed for constipation after the cleanout is done. 11/08/15   Sherrilee Gilles, NP  SUMAtriptan (IMITREX) 25 MG tablet Take 1 tablet (25 mg total) by mouth once for 1 dose. May repeat in 2 hours if headache persists or recurs. 08/08/19 08/08/19  Merrilee Jansky, MD  medroxyPROGESTERone (PROVERA) 5 MG tablet Take 1 tab PO QD until bleeding resolves. Patient not taking: Reported on 04/01/2018 07/20/15 08/08/19  Drexel Iha, MD    Physical Exam   Triage Vital Signs: ED Triage Vitals  Enc Vitals Group     BP 05/20/21 0259 120/73     Pulse Rate 05/20/21 0259  75     Resp 05/20/21 0259 19     Temp 05/20/21 0259 98.5 F (36.9 C)     Temp src --      SpO2 05/20/21 0259 99 %     Weight 05/20/21 0256 219 lb (99.3 kg)     Height 05/20/21 0256 5\' 1"  (1.549 m)     Head Circumference --      Peak Flow --      Pain Score 05/20/21 0256 10     Pain Loc --      Pain Edu? --      Excl. in GC? --     Most recent vital signs: Vitals:   05/20/21 0259 05/20/21 0518  BP: 120/73 121/75  Pulse: 75   Resp: 19 16  Temp: 98.5 F (36.9 C)   SpO2: 99% 99%     CONSTITUTIONAL: Alert and responds appropriately to questions. Well-appearing; well-nourished HEAD: Normocephalic, atraumatic EYES: Conjunctivae clear, pupils appear equal ENT: normal nose; moist mucous membranes NECK: Normal range of motion CARD: Regular rate and rhythm RESP: Normal chest excursion without splinting or tachypnea; no hypoxia or respiratory distress, speaking full sentences ABD/GI: non-distended EXT: Normal ROM in all joints, no major deformities noted, minimal soft tissue swelling to the dorsal left foot without ecchymosis or deformity.  2+ left DP pulse and normal capillary refill.  No tenderness over the left ankle or  left knee.  Normal range of motion in all joints.  Compartments soft. SKIN: Normal color for age and race, no rashes on exposed skin NEURO: Moves all extremities equally, normal speech, no facial asymmetry noted PSYCH: The patient's mood and manner are appropriate. Grooming and personal hygiene are appropriate.  ED Results / Procedures / Treatments   LABS: (all labs ordered are listed, but only abnormal results are displayed) Labs Reviewed - No data to display   EKG:  EKG Interpretation  Date/Time:    Ventricular Rate:    PR Interval:    QRS Duration:   QT Interval:    QTC Calculation:   R Axis:     Text Interpretation:            RADIOLOGY: My personal review and interpretation of imaging: X-ray shows no fracture.  I have personally  reviewed all radiology reports. DG Foot Complete Left  Result Date: 05/20/2021 CLINICAL DATA:  Left foot pain/injury EXAM: LEFT FOOT - COMPLETE 3+ VIEW COMPARISON:  None. FINDINGS: No fracture or dislocation is seen. The joint spaces are preserved. The visualized soft tissues are unremarkable. IMPRESSION: Negative. Electronically Signed   By: Charline Bills M.D.   On: 05/20/2021 03:18     PROCEDURES:  Critical Care performed: No   CRITICAL CARE Performed by: Baxter Hire Tachina Spoonemore   Total critical care time: 0 minutes  Critical care time was exclusive of separately billable procedures and treating other patients.  Critical care was necessary to treat or prevent imminent or life-threatening deterioration.  Critical care was time spent personally by me on the following activities: development of treatment plan with patient and/or surrogate as well as nursing, discussions with consultants, evaluation of patient's response to treatment, examination of patient, obtaining history from patient or surrogate, ordering and performing treatments and interventions, ordering and review of laboratory studies, ordering and review of radiographic studies, pulse oximetry and re-evaluation of patient's condition.   Procedures    IMPRESSION / MDM / ASSESSMENT AND PLAN / ED COURSE  I reviewed the triage vital signs and the nursing notes.   Patient here with left foot injury.     DIFFERENTIAL DIAGNOSIS (includes but not limited to):   Contusion, sprain, fracture.  No sign of arterial injury, DVT, compartment syndrome, gout, septic arthritis, cellulitis.   PLAN: We will give ibuprofen.  Will obtain x-ray.   MEDICATIONS GIVEN IN ED: Medications  ibuprofen (ADVIL) tablet 800 mg (800 mg Oral Given 05/20/21 0511)     ED COURSE: X-ray reviewed by myself and radiologist and shows no fracture or dislocation.  Placed in an Ace wrap for comfort.  Recommended rest, elevation and ice.  She states it is  painful to ambulate.  Patient requesting crutches but was able to ambulate out of the emergency department without using them.  Recommended follow-up with her PCP or orthopedics if symptoms not improving in 1 week with symptomatic management.  Discharged with prescription of ibuprofen.  Given work note for the next day.  At this time, I do not feel there is any life-threatening condition present. I reviewed all nursing notes, vitals, pertinent previous records.  All lab and urine results, EKGs, imaging ordered have been independently reviewed and interpreted by myself.  I reviewed all available radiology reports from any imaging ordered this visit.  Based on my assessment, I feel the patient is safe to be discharged home without further emergent workup and can continue workup as an outpatient as needed. Discussed all findings, treatment  plan as well as usual and customary return precautions with patient.  They verbalize understanding and are comfortable with this plan.  Outpatient follow-up has been provided as needed.  All questions have been answered.    CONSULTS: No orthopedic consultation needed given x-ray negative.   OUTSIDE RECORDS REVIEWED: Reviewed patient's recent primary care doctor note on 04/13/21.         FINAL CLINICAL IMPRESSION(S) / ED DIAGNOSES   Final diagnoses:  Contusion of left foot, initial encounter     Rx / DC Orders   ED Discharge Orders          Ordered    ibuprofen (ADVIL) 800 MG tablet  Every 8 hours PRN        05/20/21 0458             Note:  This document was prepared using Dragon voice recognition software and may include unintentional dictation errors.   Nymir Ringler, Layla Maw, DO 05/20/21 [phone]

## 2021-05-20 NOTE — Discharge Instructions (Addendum)
You may alternate Tylenol 1000 mg every 6 hours as needed for pain, fever and Ibuprofen 800 mg every 6-8 hours as needed for pain, fever.  Please take Ibuprofen with food.  Do not take more than 4000 mg of Tylenol (acetaminophen) in a 24 hour period. ° °

## 2021-12-17 ENCOUNTER — Emergency Department (HOSPITAL_COMMUNITY)
Admission: EM | Admit: 2021-12-17 | Discharge: 2021-12-18 | Discharge status: Against Medical Advice | Payer: Medicaid Other | Attending: Emergency Medicine | Admitting: Emergency Medicine

## 2021-12-17 ENCOUNTER — Encounter (HOSPITAL_COMMUNITY): Payer: Self-pay | Admitting: Emergency Medicine

## 2021-12-17 ENCOUNTER — Other Ambulatory Visit: Payer: Self-pay

## 2021-12-17 DIAGNOSIS — R5383 Other fatigue: Secondary | ICD-10-CM | POA: Insufficient documentation

## 2021-12-17 DIAGNOSIS — Z5321 Procedure and treatment not carried out due to patient leaving prior to being seen by health care provider: Secondary | ICD-10-CM | POA: Insufficient documentation

## 2021-12-17 DIAGNOSIS — Z20822 Contact with and (suspected) exposure to covid-19: Secondary | ICD-10-CM | POA: Insufficient documentation

## 2021-12-17 DIAGNOSIS — J029 Acute pharyngitis, unspecified: Secondary | ICD-10-CM | POA: Insufficient documentation

## 2021-12-17 DIAGNOSIS — N939 Abnormal uterine and vaginal bleeding, unspecified: Secondary | ICD-10-CM | POA: Diagnosis not present

## 2021-12-17 DIAGNOSIS — R531 Weakness: Secondary | ICD-10-CM | POA: Diagnosis not present

## 2021-12-17 LAB — CBC WITH DIFFERENTIAL/PLATELET
Abs Immature Granulocytes: 0.02 10*3/uL (ref 0.00–0.07)
Basophils Absolute: 0 10*3/uL (ref 0.0–0.1)
Basophils Relative: 0 %
Eosinophils Absolute: 0 10*3/uL (ref 0.0–0.5)
Eosinophils Relative: 0 %
HCT: 35.2 % — ABNORMAL LOW (ref 36.0–46.0)
Hemoglobin: 11.2 g/dL — ABNORMAL LOW (ref 12.0–15.0)
Immature Granulocytes: 0 %
Lymphocytes Relative: 9 %
Lymphs Abs: 0.6 10*3/uL — ABNORMAL LOW (ref 0.7–4.0)
MCH: 27.7 pg (ref 26.0–34.0)
MCHC: 31.8 g/dL (ref 30.0–36.0)
MCV: 86.9 fL (ref 80.0–100.0)
Monocytes Absolute: 0.6 10*3/uL (ref 0.1–1.0)
Monocytes Relative: 10 %
Neutro Abs: 5 10*3/uL (ref 1.7–7.7)
Neutrophils Relative %: 81 %
Platelets: 300 10*3/uL (ref 150–400)
RBC: 4.05 MIL/uL (ref 3.87–5.11)
RDW: 15.5 % (ref 11.5–15.5)
WBC: 6.2 10*3/uL (ref 4.0–10.5)
nRBC: 0 % (ref 0.0–0.2)

## 2021-12-17 LAB — URINALYSIS, ROUTINE W REFLEX MICROSCOPIC
Bilirubin Urine: NEGATIVE
Glucose, UA: NEGATIVE mg/dL
Ketones, ur: NEGATIVE mg/dL
Leukocytes,Ua: NEGATIVE
Nitrite: NEGATIVE
Protein, ur: 30 mg/dL — AB
RBC / HPF: >50 RBC/hpf — ABNORMAL HIGH (ref 0–5)
Specific Gravity, Urine: 1.012 (ref 1.005–1.030)
pH: 9 — ABNORMAL HIGH (ref 5.0–8.0)

## 2021-12-17 LAB — COMPREHENSIVE METABOLIC PANEL
ALT: 14 U/L (ref 0–44)
AST: 21 U/L (ref 15–41)
Albumin: 4.3 g/dL (ref 3.5–5.0)
Alkaline Phosphatase: 61 U/L (ref 38–126)
Anion gap: 9 (ref 5–15)
BUN: 6 mg/dL (ref 6–20)
CO2: 22 mmol/L (ref 22–32)
Calcium: 9.9 mg/dL (ref 8.9–10.3)
Chloride: 108 mmol/L (ref 98–111)
Creatinine, Ser: 0.85 mg/dL (ref 0.44–1.00)
GFR, Estimated: >60 mL/min (ref >60)
Glucose, Bld: 96 mg/dL (ref 70–99)
Potassium: 3.5 mmol/L (ref 3.5–5.1)
Sodium: 139 mmol/L (ref 135–145)
Total Bilirubin: 0.2 mg/dL — ABNORMAL LOW (ref 0.3–1.2)
Total Protein: 7.7 g/dL (ref 6.5–8.1)

## 2021-12-17 LAB — RESP PANEL BY RT-PCR (FLU A&B, COVID) ARPGX2
Influenza A by PCR: NEGATIVE
Influenza B by PCR: POSITIVE — AB
SARS Coronavirus 2 by RT PCR: NEGATIVE

## 2021-12-17 LAB — GROUP A STREP BY PCR: Group A Strep by PCR: NOT DETECTED

## 2021-12-17 LAB — PREGNANCY, URINE: Preg Test, Ur: NEGATIVE

## 2021-12-17 LAB — LIPASE, BLOOD: Lipase: 22 U/L (ref 11–51)

## 2021-12-17 NOTE — ED Triage Notes (Signed)
Pt presented to ED with c/o sore throat chills and nausea that upon awakening.

## 2021-12-17 NOTE — ED Provider Triage Note (Signed)
Emergency Medicine Provider Triage Evaluation Note  Hayley Cannon , a 21 y.o. female  was evaluated in triage.  Pt complains of fatigue, chills, fevers, sore throat, nasal congestion, rhinorrhea, cough.  The symptoms presented this morning.  Also complaining of abdominal pain that she describes as an intermittent mild ache. was seen at [facility] ED 2 days ago for evaluation of vaginal bleeding and weakness.  Reports vaginal bleeding has decreased.  Has had irregular cycles her entire life with heavy menstrual flow.  Review of Systems  Positive: As above Negative: Chest pain, dysuria, frequency, urgency  Physical Exam  BP (!) 157/88 (BP Location: Right Arm)   Pulse 96   Temp 100 F (37.8 C) (Oral)   Resp 16   SpO2 100%  Gen:   Awake, no distress   Resp:  Normal effort  MSK:   Moves extremities without difficulty  Other:  Posterior pharynx erythematous without exudates.  Uvula midline.  No obvious peritonsillar abscess  Medical Decision Making  Medically screening exam initiated at 9:24 PM.  Appropriate orders placed.  Collyn Hoxworth was informed that the remainder of the evaluation will be completed by another provider, this initial triage assessment does not replace that evaluation, and the importance of remaining in the ED until their evaluation is complete.  We will obtain basic labs, strep, respiratory panel, urinalysis   Roylene Reason, Hershal Coria 12/17/21 2126

## 2021-12-18 NOTE — ED Notes (Signed)
X2 for vitals recheck with no response °

## 2021-12-18 NOTE — ED Notes (Signed)
Pt called 2x no response  

## 2021-12-23 ENCOUNTER — Ambulatory Visit
Admission: EM | Admit: 2021-12-23 | Discharge: 2021-12-23 | Disposition: A | Discharge status: Home / Self Care | Payer: Medicaid Other | Attending: Emergency Medicine | Admitting: Emergency Medicine

## 2021-12-23 ENCOUNTER — Ambulatory Visit (INDEPENDENT_AMBULATORY_CARE_PROVIDER_SITE_OTHER): Payer: Medicaid Other

## 2021-12-23 DIAGNOSIS — R093 Abnormal sputum: Secondary | ICD-10-CM | POA: Diagnosis not present

## 2021-12-23 DIAGNOSIS — R0602 Shortness of breath: Secondary | ICD-10-CM

## 2021-12-23 DIAGNOSIS — R042 Hemoptysis: Secondary | ICD-10-CM

## 2021-12-23 DIAGNOSIS — J101 Influenza due to other identified influenza virus with other respiratory manifestations: Secondary | ICD-10-CM

## 2021-12-23 DIAGNOSIS — R058 Other specified cough: Secondary | ICD-10-CM | POA: Diagnosis not present

## 2021-12-23 DIAGNOSIS — R059 Cough, unspecified: Secondary | ICD-10-CM | POA: Diagnosis not present

## 2021-12-23 MED ORDER — PROMETHAZINE-DM 6.25-15 MG/5ML PO SYRP: 5.0000 mL | ORAL_SOLUTION | Freq: Four times a day (QID) | ORAL | 0 refills | Status: AC | PRN

## 2021-12-23 MED ORDER — ALBUTEROL SULFATE (2.5 MG/3ML) 0.083% IN NEBU
2.5000 mg | INHALATION_SOLUTION | Freq: Once | RESPIRATORY_TRACT | Status: AC
  Administered 2021-12-23: 2.5 mg via RESPIRATORY_TRACT

## 2021-12-23 MED ORDER — AZITHROMYCIN 250 MG PO TABS: ORAL_TABLET | ORAL | 0 refills | Status: AC

## 2021-12-23 MED ORDER — AEROCHAMBER PLUS FLO-VU LARGE MISC: 1.0000 | Freq: Once | 0 refills | Status: AC

## 2021-12-23 MED ORDER — ALBUTEROL SULFATE HFA 108 (90 BASE) MCG/ACT IN AERS: 2.0000 | INHALATION_SPRAY | Freq: Four times a day (QID) | RESPIRATORY_TRACT | 0 refills | Status: AC | PRN

## 2021-12-23 MED ORDER — METHYLPREDNISOLONE 8 MG PO TABS: ORAL_TABLET | ORAL | 0 refills | Status: AC

## 2021-12-23 MED ORDER — GUAIFENESIN 400 MG PO TABS: ORAL_TABLET | ORAL | 0 refills | Status: AC

## 2021-12-23 NOTE — Discharge Instructions (Signed)
Your symptoms and physical exam findings are concerning for a viral respiratory infection.     Please see the list below for recommended medications, dosages and frequencies to provide relief of your current symptoms:    Z-Pak (azithromycin):  take 2 tablets the first day, then take 1 tablet daily every day thereafter until complete.   Medrol Dosepak (methylprednisolone): This is a steroid that will significantly calm your upper and lower airways, please take the daily recommended quantity of tablets daily with your breakfast meal starting tomorrow morning until the prescription is complete.      Robitussin, Mucinex (guaifenesin): This is an expectorant.  This helps break up chest congestion and loosen up thick nasal drainage making phlegm and drainage more liquid and therefore easier to remove.  I recommend being 400 mg three times daily as needed.      Promethazine DM: Promethazine is both a nasal decongestant and an antinausea medication that makes most patients feel fairly sleepy.  The DM is dextromethorphan, a cough suppressant found in many over-the-counter cough medications.  Please take 5 mL before bedtime to minimize your cough which will help you sleep better.  I have sent a prescription for this medication to your pharmacy.  Please follow-up within the next 5-7 days either with your primary care provider or urgent care if your symptoms do not resolve.  If you do not have a primary care provider, we will assist you in finding one.   Thank you for visiting urgent care today.  We appreciate the opportunity to participate in your care.

## 2021-12-23 NOTE — ED Triage Notes (Signed)
The pt c/o SOB, chills, nausea, vomiting and cough, that began Saturday.   Home interventions: motrin

## 2021-12-23 NOTE — ED Provider Notes (Signed)
[facility]    CSN: [id number] Arrival date & time: 12/23/21  5732    HISTORY   Chief Complaint  Patient presents with   Cough   Nausea   Vomiting   Shortness of Breath   Chills   HPI Hayley Cannon is a pleasant, 21 y.o. female who presents to urgent care today. Patient tested positive for influenza on December 17, 2021.  Patient states that she has been feeling poorly since a few days prior to that.  Patient states that her cough is lingered, has become productive of blood-tinged sputum, is worse at night, unable to sleep.  Patient states sometimes she coughs so hard she becomes lightheaded.  Patient states she is also had some nausea vomiting along with a cough but that is better.  Patient states has been taking Motrin and some over-the-counter cough and cold preparation she cannot recall the name of.  Patient states she continues to have a sore throat.  Patient states she also had a very bad headache at the beginning of all this but that is also resolved.  The history is provided by the patient.   History reviewed. No pertinent past medical history. There are no problems to display for this patient.  History reviewed. No pertinent surgical history. OB History     Gravida  0   Para  0   Term  0   Preterm  0   AB  0   Living  0      SAB  0   IAB  0   Ectopic  0   Multiple  0   Live Births             Home Medications    Prior to Admission medications   Medication Sig Start Date End Date Taking? Authorizing Provider  albuterol (VENTOLIN HFA) 108 (90 Base) MCG/ACT inhaler Inhale 2 puffs into the lungs every 6 (six) hours as needed for wheezing or shortness of breath (Cough). 12/23/21  Yes Theadora Rama Scales, PA-C  azithromycin (ZITHROMAX) 250 MG tablet Take 2 tablets (500 mg total) by mouth daily for 1 day, THEN 1 tablet (250 mg total) daily for 4 days. 12/23/21 12/27/21 Yes Theadora Rama Scales, PA-C  guaifenesin (HUMIBID E) 400 MG  TABS tablet Take 1 tablet 3 times daily as needed for chest congestion and cough 12/23/21  Yes Theadora Rama Scales, PA-C  methylPREDNISolone (MEDROL) 8 MG tablet Take 2 tablets (16 mg total) by mouth daily for 1 day, THEN 1.5 tablets (12 mg total) daily for 1 day, THEN 1 tablet (8 mg total) daily for 1 day, THEN 0.5 tablets (4 mg total) daily for 1 day. 12/23/21 12/27/21 Yes Theadora Rama Scales, PA-C  promethazine-dextromethorphan (PROMETHAZINE-DM) 6.25-15 MG/5ML syrup Take 5 mLs by mouth 4 (four) times daily as needed for cough. 12/23/21  Yes Theadora Rama Scales, PA-C  Spacer/Aero-Holding Chambers (AEROCHAMBER PLUS FLO-VU LARGE) MISC 1 each by Other route once for 1 dose. 12/23/21 12/23/21 Yes Theadora Rama Scales, PA-C  medroxyPROGESTERone (PROVERA) 5 MG tablet Take 1 tab PO QD until bleeding resolves. Patient not taking: Reported on 04/01/2018 07/20/15 08/08/19  Burroughs, Cherre Robins, MD    Family History Family History  Problem Relation Age of Onset   Healthy Mother    Healthy Sister    Social History Social History   Tobacco Use   Smoking status: Never   Smokeless tobacco: Never  Vaping Use   Vaping Use: Never used  Substance Use Topics  Alcohol use: No   Drug use: No   Allergies   Patient has no known allergies.  Review of Systems Review of Systems Pertinent findings revealed after performing a 14 point review of systems has been noted in the history of present illness.  Physical Exam Triage Vital Signs ED Triage Vitals  Enc Vitals Group     BP 01/28/21 0827 (!) 147/82     Pulse Rate 01/28/21 0827 72     Resp 01/28/21 0827 18     Temp 01/28/21 0827 98.3 F (36.8 C)     Temp Source 01/28/21 0827 Oral     SpO2 01/28/21 0827 98 %     Weight --      Height --      Head Circumference --      Peak Flow --      Pain Score 01/28/21 0826 5     Pain Loc --      Pain Edu? --      Excl. in GC? --   No data found.  Updated Vital Signs BP 117/87 (BP Location:  Left Arm)   Pulse 95   Temp 98.9 F (37.2 C) (Oral)   Resp 16   LMP 12/21/2021 (Approximate)   SpO2 99%   Physical Exam Vitals and nursing note reviewed.  Constitutional:      General: She is not in acute distress.    Appearance: Normal appearance. She is not ill-appearing.  HENT:     Head: Normocephalic and atraumatic.     Salivary Glands: Right salivary gland is not diffusely enlarged or tender. Left salivary gland is not diffusely enlarged or tender.     Right Ear: Tympanic membrane, ear canal and external ear normal. No drainage. No middle ear effusion. There is no impacted cerumen. Tympanic membrane is not erythematous or bulging.     Left Ear: Tympanic membrane, ear canal and external ear normal. No drainage.  No middle ear effusion. There is no impacted cerumen. Tympanic membrane is not erythematous or bulging.     Nose: Nose normal. No nasal deformity, septal deviation, mucosal edema, congestion or rhinorrhea.     Right Turbinates: Not enlarged, swollen or pale.     Left Turbinates: Not enlarged, swollen or pale.     Right Sinus: No maxillary sinus tenderness or frontal sinus tenderness.     Left Sinus: No maxillary sinus tenderness or frontal sinus tenderness.     Mouth/Throat:     Lips: Pink. No lesions.     Mouth: Mucous membranes are moist. No oral lesions.     Pharynx: Oropharynx is clear. Uvula midline. No posterior oropharyngeal erythema or uvula swelling.     Tonsils: No tonsillar exudate. 0 on the right. 0 on the left.  Eyes:     General: Lids are normal.        Right eye: No discharge.        Left eye: No discharge.     Extraocular Movements: Extraocular movements intact.     Conjunctiva/sclera: Conjunctivae normal.     Right eye: Right conjunctiva is not injected.     Left eye: Left conjunctiva is not injected.  Neck:     Trachea: Trachea and phonation normal.  Cardiovascular:     Rate and Rhythm: Normal rate and regular rhythm.     Pulses: Normal pulses.      Heart sounds: Normal heart sounds. No murmur heard.    No friction rub. No gallop.  Pulmonary:  Effort: Pulmonary effort is normal. No tachypnea, bradypnea, accessory muscle usage, prolonged expiration, respiratory distress or retractions.     Breath sounds: No stridor, decreased air movement or transmitted upper airway sounds. Examination of the right-upper field reveals decreased breath sounds, wheezing and rales. Examination of the left-upper field reveals decreased breath sounds and rales. Examination of the right-middle field reveals decreased breath sounds and rales. Examination of the left-middle field reveals decreased breath sounds and rales. Examination of the right-lower field reveals decreased breath sounds and rales. Examination of the left-lower field reveals decreased breath sounds and rales. Decreased breath sounds, wheezing and rales present. No rhonchi.  Chest:     Chest wall: No tenderness.  Musculoskeletal:        General: Normal range of motion.     Cervical back: Normal range of motion and neck supple. Normal range of motion.  Lymphadenopathy:     Cervical: No cervical adenopathy.  Skin:    General: Skin is warm and dry.     Findings: No erythema or rash.  Neurological:     General: No focal deficit present.     Mental Status: She is alert and oriented to person, place, and time.  Psychiatric:        Mood and Affect: Mood normal.        Behavior: Behavior normal.     Visual Acuity Right Eye Distance:   Left Eye Distance:   Bilateral Distance:    Right Eye Near:   Left Eye Near:    Bilateral Near:     [facility] Couse / Diagnostics / Procedures:     Radiology DG Chest 2 View  Result Date: 12/23/2021 CLINICAL DATA:  bloody sputum, cough, shortness of breath EXAM: CHEST - 2 VIEW COMPARISON:  10/18/2020 FINDINGS: The heart size and mediastinal contours are within normal limits. Both lungs are clear. The visualized skeletal structures are unremarkable. Similar  prominent left lower lobe retrocardiac vascular markings. Trachea midline. No significant interval change. IMPRESSION: Stable exam.  No active chest disease by plain radiography Electronically Signed   By: Jerilynn Mages.  Shick M.D.   On: 12/23/2021 11:01    Procedures Procedures (including critical care time) EKG  Pending results:  Labs Reviewed - No data to display  Medications Ordered in UC: Medications  albuterol (PROVENTIL) (2.5 MG/3ML) 0.083% nebulizer solution 2.5 mg (2.5 mg Nebulization Given 12/23/21 1103)    UC Diagnoses / Final Clinical Impressions(s)   I have reviewed the triage vital signs and the nursing notes.  Pertinent labs & imaging results that were available during my care of the patient were reviewed by me and considered in my medical decision making (see chart for details).    Final diagnoses:  Influenza due to influenza virus, type B  Hemoptysis  Productive cough   X-ray of chest was unremarkable, patient advised.  Given the patient is coughing up blood, recommend antibiotics.  For wheezing, recommend steroid and albuterol.  Patient a good response to nebulized albuterol treatment during visit today.  Supportive medications also prescribed.  Return precautions advised.  ED Prescriptions     Medication Sig Dispense Auth. Provider   azithromycin (ZITHROMAX) 250 MG tablet Take 2 tablets (500 mg total) by mouth daily for 1 day, THEN 1 tablet (250 mg total) daily for 4 days. 6 tablet Lynden Oxford Scales, PA-C   albuterol (VENTOLIN HFA) 108 (90 Base) MCG/ACT inhaler Inhale 2 puffs into the lungs every 6 (six) hours as needed for wheezing or shortness of  breath (Cough). 18 g Theadora Rama Scales, PA-C   Spacer/Aero-Holding Chambers (AEROCHAMBER PLUS FLO-VU LARGE) MISC 1 each by Other route once for 1 dose. 1 each Theadora Rama Scales, PA-C   methylPREDNISolone (MEDROL) 8 MG tablet Take 2 tablets (16 mg total) by mouth daily for 1 day, THEN 1.5 tablets (12 mg total) daily for  1 day, THEN 1 tablet (8 mg total) daily for 1 day, THEN 0.5 tablets (4 mg total) daily for 1 day. 5 tablet Theadora Rama Scales, PA-C   promethazine-dextromethorphan (PROMETHAZINE-DM) 6.25-15 MG/5ML syrup Take 5 mLs by mouth 4 (four) times daily as needed for cough. 118 mL Theadora Rama Scales, PA-C   guaifenesin (HUMIBID E) 400 MG TABS tablet Take 1 tablet 3 times daily as needed for chest congestion and cough 21 tablet Theadora Rama Scales, PA-C      PDMP not reviewed this encounter.  Disposition Upon Discharge:  Condition: stable for discharge home Home: take medications as prescribed; routine discharge instructions as discussed; follow up as advised.  Patient presented with an acute illness with associated systemic symptoms and significant discomfort requiring urgent management. In my opinion, this is a condition that a prudent lay person (someone who possesses an average knowledge of health and medicine) may potentially expect to result in complications if not addressed urgently such as respiratory distress, impairment of bodily function or dysfunction of bodily organs.   Routine symptom specific, illness specific and/or disease specific instructions were discussed with the patient and/or caregiver at length.   As such, the patient has been evaluated and assessed, work-up was performed and treatment was provided in alignment with urgent care protocols and evidence based medicine.  Patient/parent/caregiver has been advised that the patient may require follow up for further testing and treatment if the symptoms continue in spite of treatment, as clinically indicated and appropriate.  If the patient was tested for COVID-19, Influenza and/or RSV, then the patient/parent/guardian was advised to isolate at home pending the results of his/her diagnostic coronavirus test and potentially longer if they're positive. I have also advised pt that if his/her COVID-19 test returns positive, it's  recommended to self-isolate for at least 10 days after symptoms first appeared AND until fever-free for 24 hours without fever reducer AND other symptoms have improved or resolved. Discussed self-isolation recommendations as well as instructions for household member/close contacts as per the [facility] and [place] DHHS, and also gave patient the COVID packet with this information.  Patient/parent/caregiver has been advised to return to the [facility] or PCP in 3-5 days if no better; to PCP or the Emergency Department if new signs and symptoms develop, or if the current signs or symptoms continue to change or worsen for further workup, evaluation and treatment as clinically indicated and appropriate  The patient will follow up with their current PCP if and as advised. If the patient does not currently have a PCP we will assist them in obtaining one.   The patient may need specialty follow up if the symptoms continue, in spite of conservative treatment and management, for further workup, evaluation, consultation and treatment as clinically indicated and appropriate.  Patient/parent/caregiver verbalized understanding and agreement of plan as discussed.  All questions were addressed during visit.  Please see discharge instructions below for further details of plan.  Discharge Instructions:   Discharge Instructions      Your symptoms and physical exam findings are concerning for a viral respiratory infection.     Please see the list below for recommended  medications, dosages and frequencies to provide relief of your current symptoms:    Z-Pak (azithromycin):  take 2 tablets the first day, then take 1 tablet daily every day thereafter until complete.   Medrol Dosepak (methylprednisolone): This is a steroid that will significantly calm your upper and lower airways, please take the daily recommended quantity of tablets daily with your breakfast meal starting tomorrow morning until the prescription is complete.       Robitussin, Mucinex (guaifenesin): This is an expectorant.  This helps break up chest congestion and loosen up thick nasal drainage making phlegm and drainage more liquid and therefore easier to remove.  I recommend being 400 mg three times daily as needed.      Promethazine DM: Promethazine is both a nasal decongestant and an antinausea medication that makes most patients feel fairly sleepy.  The DM is dextromethorphan, a cough suppressant found in many over-the-counter cough medications.  Please take 5 mL before bedtime to minimize your cough which will help you sleep better.  I have sent a prescription for this medication to your pharmacy.  Please follow-up within the next 5-7 days either with your primary care provider or urgent care if your symptoms do not resolve.  If you do not have a primary care provider, we will assist you in finding one.   Thank you for visiting urgent care today.  We appreciate the opportunity to participate in your care.        This office note has been dictated using Teaching laboratory technician.  Unfortunately, this method of dictation can sometimes lead to typographical or grammatical errors.  I apologize for your inconvenience in advance if this occurs.  Please do not hesitate to reach out to me if clarification is needed.      Theadora Rama Scales, PA-C 12/23/21 1114

## 2022-03-28 ENCOUNTER — Ambulatory Visit (INDEPENDENT_AMBULATORY_CARE_PROVIDER_SITE_OTHER): Payer: Medicaid Other | Admitting: Plastic Surgery

## 2022-03-28 ENCOUNTER — Encounter: Payer: Self-pay | Admitting: Plastic Surgery

## 2022-03-28 VITALS — BP 122/83 | HR 86 | Ht 60.0 in | Wt 200.8 lb

## 2022-03-28 DIAGNOSIS — F17210 Nicotine dependence, cigarettes, uncomplicated: Secondary | ICD-10-CM

## 2022-03-28 DIAGNOSIS — N62 Hypertrophy of breast: Secondary | ICD-10-CM

## 2022-03-28 DIAGNOSIS — M542 Cervicalgia: Secondary | ICD-10-CM

## 2022-03-28 DIAGNOSIS — Z6839 Body mass index (BMI) 39.0-39.9, adult: Secondary | ICD-10-CM

## 2022-03-28 DIAGNOSIS — M546 Pain in thoracic spine: Secondary | ICD-10-CM | POA: Diagnosis not present

## 2022-03-28 NOTE — Progress Notes (Signed)
Referring Provider No referring provider defined for this encounter.   CC:  Chief Complaint  Patient presents with   Consult           Hayley Cannon is an 21 y.o. female.  HPI: Hayley Cannon presents today for evaluation for possible breast reduction.  She states that her breasts have continually gotten larger as she has gotten older and they currently are difficult to find bras that fit.  She states that the fall to the side and cause her discomfort while she is sleeping as well as causing discomfort in her upper back and neck.  She denies any significant past medical history significant past surgical history.  She does not take any blood thinners and has no bleeding disorders.  She is a smoker  No Known Allergies  Outpatient Encounter Medications as of 03/28/2022  Medication Sig   albuterol (VENTOLIN HFA) 108 (90 Base) MCG/ACT inhaler Inhale 2 puffs into the lungs every 6 (six) hours as needed for wheezing or shortness of breath (Cough).   guaifenesin (HUMIBID E) 400 MG TABS tablet Take 1 tablet 3 times daily as needed for chest congestion and cough   promethazine-dextromethorphan (PROMETHAZINE-DM) 6.25-15 MG/5ML syrup Take 5 mLs by mouth 4 (four) times daily as needed for cough.   [DISCONTINUED] medroxyPROGESTERone (PROVERA) 5 MG tablet Take 1 tab PO QD until bleeding resolves. (Patient not taking: Reported on 04/01/2018)   No facility-administered encounter medications on file as of 03/28/2022.     No past medical history on file.  No past surgical history on file.  Family History  Problem Relation Age of Onset   Healthy Mother    Healthy Sister     Social History   Social History Narrative   Not on file     Review of Systems General: Denies fevers, chills, weight loss CV: Denies chest pain, shortness of breath, palpitations Breast: No breast disease, no history of nipple discharge, breasts are large and pendulous and cause upper back and neck pain and  interfere with daily activities.  Physical Exam    03/28/2022    9:43 AM 12/23/2021    9:57 AM 12/17/2021    9:04 PM  Vitals with BMI  Height 5\' 0"     Weight 200 lbs 13 oz    BMI 39.22    Systolic 122 117  Diastolic 83 87 88  Pulse 86 95 96    General:  No acute distress,  Alert and oriented, Non-Toxic, Normal speech and affect Breast: On physical exam there are no masses no nipple discharge no nipple abnormalities.  Her breasts are pendulous with grade 3 ptosis.  She measures 39 cm from the sternal notch on the right and 40 cm from the sternal notch on the left.  She measures 22 cm from the fold to the nipple on the right and 22 cm from the fold to the nipple on the left. Mammogram: She has not begun mammographic screening Assessment/Plan Macromastia: The patient is an acceptable candidate for breast reduction though her breast reduction will be quite difficult due to the length of her breast especially the length of the fold to the nipple.  I believe I can remove between 5 and 600 g per breast. She understands that she cannot be scheduled for surgery until she has been a non-smoker for at least 2 months.  She understands that she has a high risk patient for loss of her nipple due to the length of her breasts and  that she may require free nipple grafting.  We discussed free nipple grafting and the fact that the nipple will no longer have normal feeling and may have significant cosmetic changes.  I showed her the location of the incisions and the unpredictable nature of scarring.  The risks of bleeding, infection, and seroma formation.  We discussed the postoperative course including limitation of heavy lifting to 20 pounds, limiting vigorous activity, and refraining from submerging the incisions in water for 6 weeks.  She understands all of these and would like to proceed with surgery .  Photographs were taken today with her permission.  Santiago Glad 03/28/2022, 11:11 AM

## 2022-06-14 ENCOUNTER — Telehealth: Payer: Self-pay | Admitting: Plastic Surgery

## 2022-06-14 NOTE — Telephone Encounter (Signed)
LVM and my chart message for pt to return call.  Insurance has been approved [id number] good from 3.4.24 to 6.1.24.

## 2022-06-21 ENCOUNTER — Telehealth: Payer: Self-pay | Admitting: *Deleted

## 2022-06-21 NOTE — Telephone Encounter (Signed)
LVM to schedule surgery

## 2022-07-04 ENCOUNTER — Telehealth: Payer: Self-pay | Admitting: *Deleted

## 2022-07-04 NOTE — Telephone Encounter (Signed)
No valid telephone number on file nor release of information for additional contacts.  Per answer at previous number listed, someone new owns that number  [facility] message sent to patient

## 2022-07-31 ENCOUNTER — Telehealth: Payer: Self-pay | Admitting: *Deleted

## 2022-07-31 NOTE — Telephone Encounter (Signed)
Without a working contact number we cannot proceed with scheduling surgery for this patient.

## 2023-03-20 IMAGING — DX DG FOOT COMPLETE 3+V*L*
3 series · 3 of 3 positions shown · non-contrast
Comparison: None.

CLINICAL DATA: Left foot pain/injury

EXAM:
LEFT FOOT - COMPLETE 3+ VIEW

[foot ap]
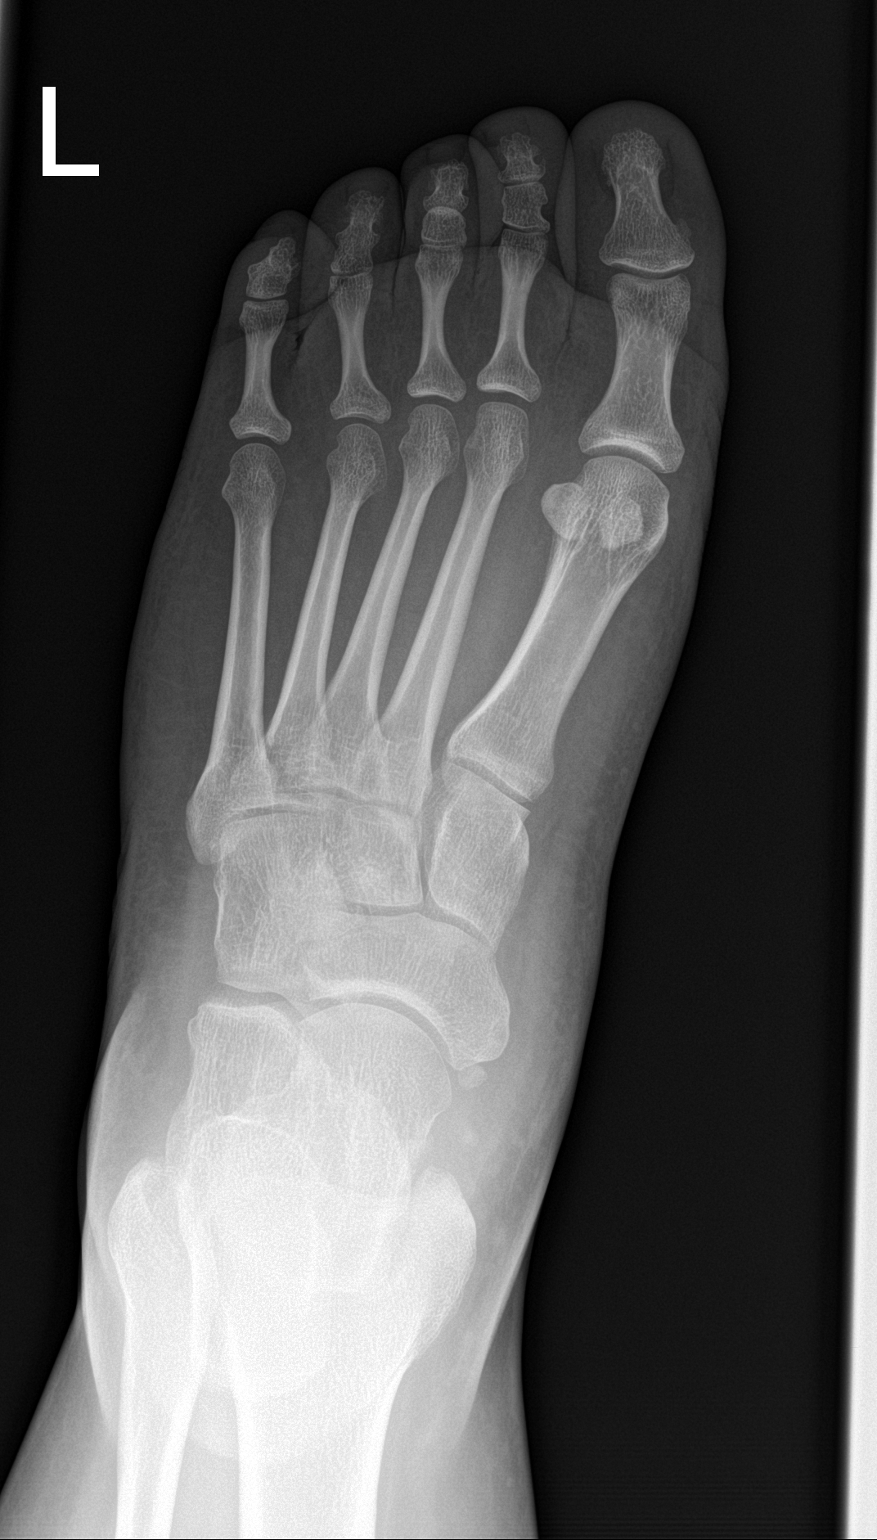

[foot obl]
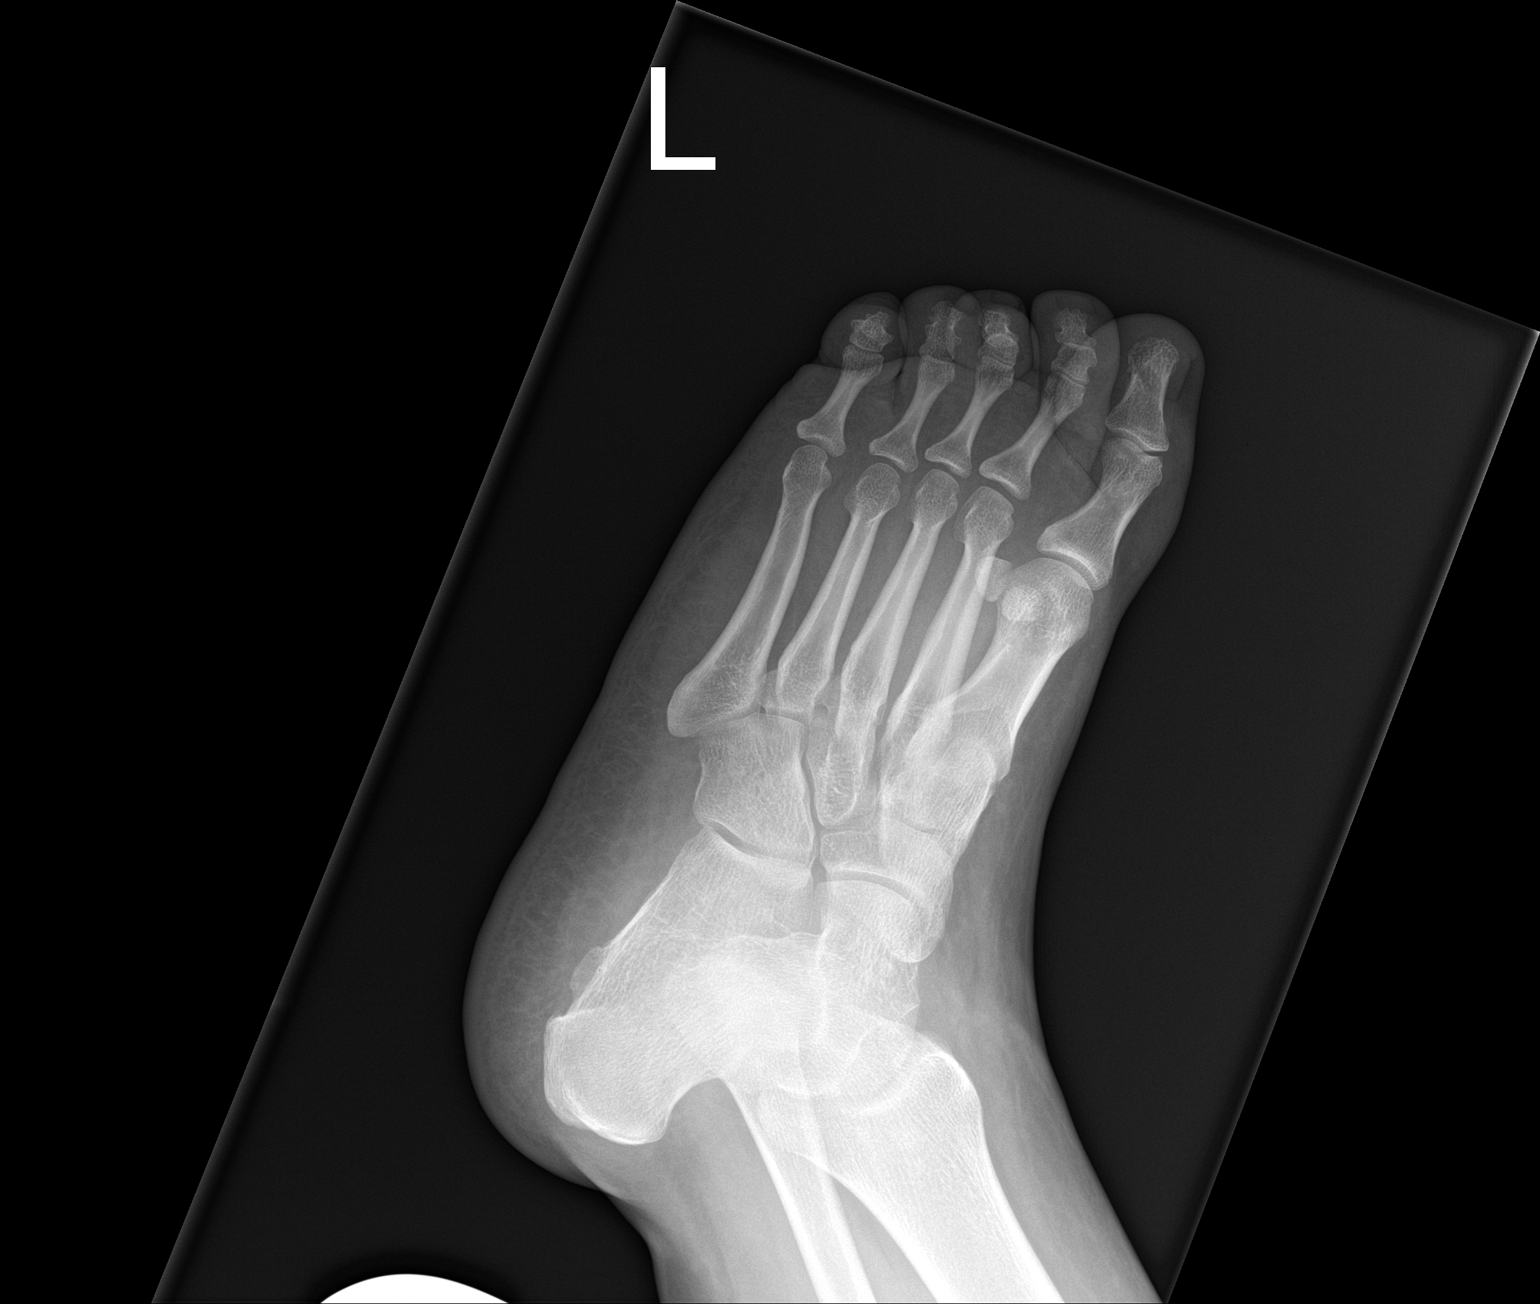

[foot lat]
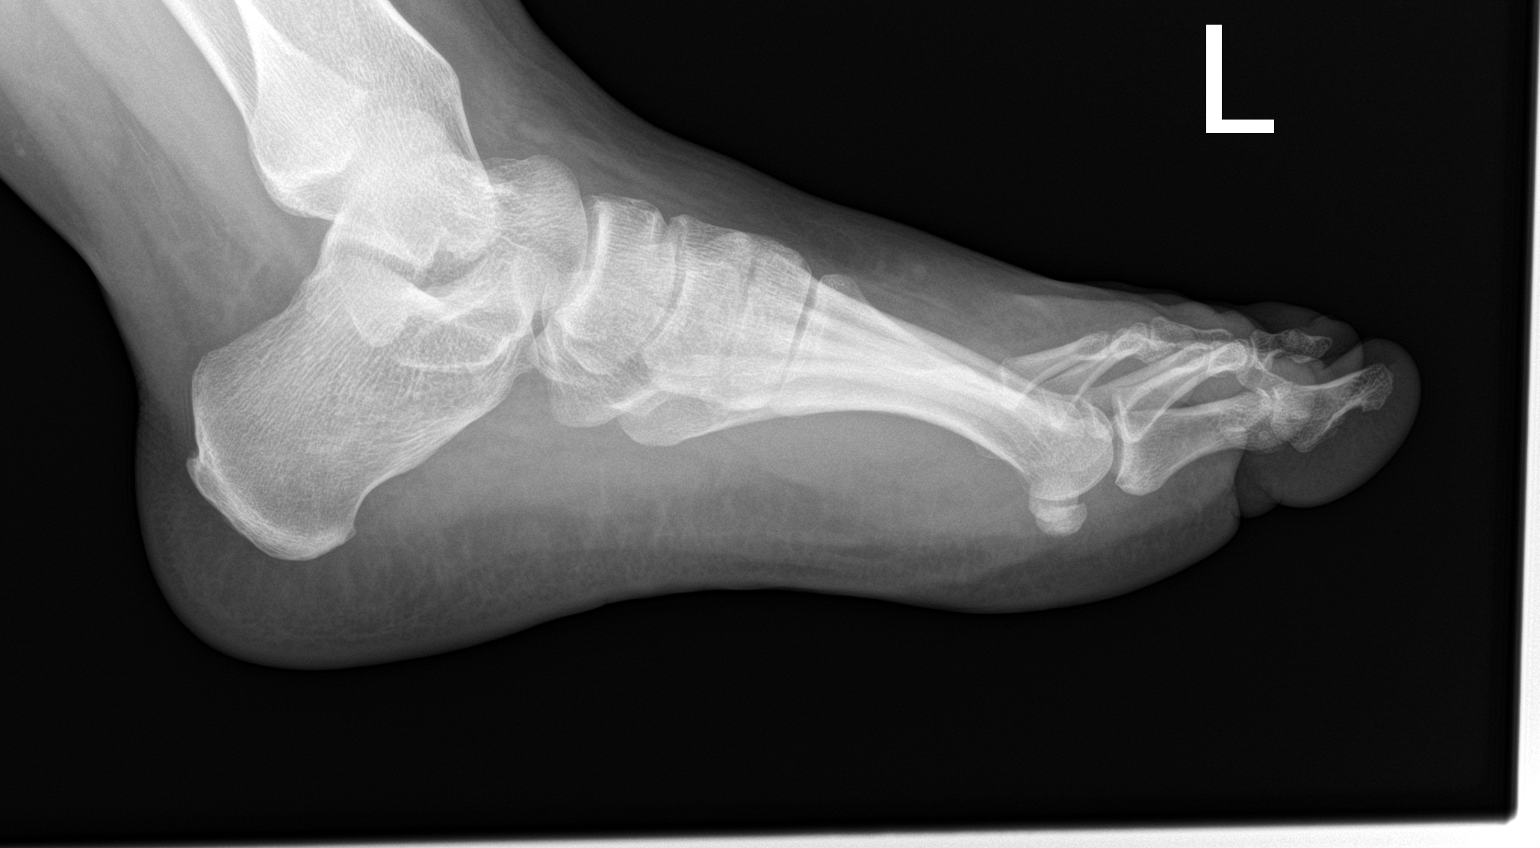

[3 of 3 positions shown; findings below may reference images not displayed]

FINDINGS: No fracture or dislocation is seen.

The joint spaces are preserved.

The visualized soft tissues are unremarkable.
IMPRESSION: Negative.
